# Patient Record
Sex: Female | Born: 1940
Health system: Southern US, Community
[De-identification: ages and names within clinical notes are randomized; demographics above are authoritative.]

## PROBLEM LIST (undated history)

## (undated) DIAGNOSIS — D649 Anemia, unspecified: Secondary | ICD-10-CM

## (undated) DIAGNOSIS — F419 Anxiety disorder, unspecified: Secondary | ICD-10-CM

## (undated) DIAGNOSIS — K219 Gastro-esophageal reflux disease without esophagitis: Secondary | ICD-10-CM

## (undated) HISTORY — PX: OTHER SURGICAL HISTORY: SHX169

## (undated) HISTORY — PX: TUBAL LIGATION: SHX77

## (undated) HISTORY — DX: Anemia, unspecified: D64.9

## (undated) HISTORY — PX: CATARACT EXTRACTION: SUR2

---

## 2012-05-23 ENCOUNTER — Encounter: Payer: Self-pay | Admitting: Family Medicine

## 2012-05-23 ENCOUNTER — Ambulatory Visit (INDEPENDENT_AMBULATORY_CARE_PROVIDER_SITE_OTHER): Payer: Medicare Other | Admitting: Family Medicine

## 2012-05-23 VITALS — BP 150/90 | HR 87 | Resp 16 | Ht 64.5 in | Wt 132.4 lb

## 2012-05-23 DIAGNOSIS — Z1321 Encounter for screening for nutritional disorder: Secondary | ICD-10-CM

## 2012-05-23 DIAGNOSIS — H532 Diplopia: Secondary | ICD-10-CM

## 2012-05-23 DIAGNOSIS — Z130 Encounter for screening for diseases of the blood and blood-forming organs and certain disorders involving the immune mechanism: Secondary | ICD-10-CM

## 2012-05-23 DIAGNOSIS — Z720 Tobacco use: Secondary | ICD-10-CM

## 2012-05-23 DIAGNOSIS — D649 Anemia, unspecified: Secondary | ICD-10-CM | POA: Insufficient documentation

## 2012-05-23 DIAGNOSIS — Z13220 Encounter for screening for lipoid disorders: Secondary | ICD-10-CM

## 2012-05-23 DIAGNOSIS — R030 Elevated blood-pressure reading, without diagnosis of hypertension: Secondary | ICD-10-CM

## 2012-05-23 DIAGNOSIS — IMO0001 Reserved for inherently not codable concepts without codable children: Secondary | ICD-10-CM

## 2012-05-23 DIAGNOSIS — F17200 Nicotine dependence, unspecified, uncomplicated: Secondary | ICD-10-CM

## 2012-05-23 DIAGNOSIS — H1130 Conjunctival hemorrhage, unspecified eye: Secondary | ICD-10-CM

## 2012-05-23 NOTE — Assessment & Plan Note (Signed)
Diplopia on and off for the past few months, possible just related to eye, may be secondary to elevated blood pressure, or possible intracranial mass though neuro exam normal Refer to optho for check, RTC 4 weeks, if there exam is negative will obtain MRI head

## 2012-05-23 NOTE — Assessment & Plan Note (Signed)
Check CBC 

## 2012-05-23 NOTE — Assessment & Plan Note (Addendum)
This should resolve without intervention, she can stop visine

## 2012-05-23 NOTE — Assessment & Plan Note (Signed)
Improved some on recheck, I think she does have underlying HTN. Recheck in 4 weeks , labs to be done, CBC, CMET, TSH, lipid panel, Vit D

## 2012-05-23 NOTE — Assessment & Plan Note (Signed)
Counseled on cessation 

## 2012-05-23 NOTE — Patient Instructions (Signed)
I recommend calcium (1200mg ) and Vit D (800IU) Get the labs done fasting You will be referred to eye doctor F/U 4 weeks for blood pressure and vision

## 2012-05-23 NOTE — Progress Notes (Addendum)
  Subjective:    Patient ID: Danielle Price, female    DOB: 1941-08-24, 71 y.o.   MRN: [id number]  HPI Pt presents to establish care, no PCP > 15 years. She has not seen a physician in 15 years. She presents with redness of left eye for the past week, no injury to eye, she has had in the past but it resolved after a few weeks. She also has double vision on and off for the past few months, requesting eye referral, her optmetrist in the past had concern for early glaucoma. Her brother had a benign brain tumor Anemia- history of chronic anemia, had allergic reaction to [facility] in the 70's and was never tried on anything else.   Review of Systems   GEN- denies fatigue, fever, weight loss,weakness, recent illness HEENT- denies eye drainage, +change in vision, nasal discharge, CVS- denies chest pain, palpitations RESP- denies SOB, cough, wheeze ABD- denies N/V, change in stools, abd pain GU- denies dysuria, hematuria, dribbling, incontinence MSK- denies joint pain, muscle aches, injury Neuro- denies headache, dizziness, syncope, seizure activity      Objective:   Physical Exam GEN- NAD, alert and oriented x3 HEENT- PERRL, EOMI,left subconjunctival hemorrhage, ? Blocked gland in right lower lid pink conjunctiva, MMM, oropharynx clear, fundoscopic exam benign Neck- Supple,  CVS- RRR, no murmur RESP-CTAB ABD-NABS,soft,NT,ND EXT- No edema Pulses- Radial, DP- 2+ Neuro-CNII-XII grossly in tact, motor equal bialt, no focal deficits, sensation normal, speech normal Psych-normal affect and Mood        Assessment & Plan:

## 2012-05-23 NOTE — Addendum Note (Signed)
Addended by: Milinda Antis F on: 05/23/2012 04:52 PM   Modules accepted: Orders

## 2012-05-24 ENCOUNTER — Other Ambulatory Visit: Payer: Self-pay | Admitting: Family Medicine

## 2012-05-24 LAB — COMPREHENSIVE METABOLIC PANEL
ALT: 10 U/L (ref 0–35)
AST: 15 U/L (ref 0–37)
BUN: 12 mg/dL (ref 6–23)
CO2: 27 mEq/L (ref 19–32)
Creat: 0.78 mg/dL (ref 0.50–1.10)
Total Bilirubin: 0.9 mg/dL (ref 0.3–1.2)

## 2012-05-24 LAB — CBC WITH DIFFERENTIAL/PLATELET
Basophils Absolute: 0 10*3/uL (ref 0.0–0.1)
Eosinophils Relative: 1 % (ref 0–5)
HCT: 42.2 % (ref 36.0–46.0)
Hemoglobin: 14.5 g/dL (ref 12.0–15.0)
Lymphocytes Relative: 23 % (ref 12–46)
Lymphs Abs: 1.7 10*3/uL (ref 0.7–4.0)
MCV: 89.8 fL (ref 78.0–100.0)
Monocytes Absolute: 0.7 10*3/uL (ref 0.1–1.0)
Monocytes Relative: 9 % (ref 3–12)
RDW: 13 % (ref 11.5–15.5)
WBC: 7.8 10*3/uL (ref 4.0–10.5)

## 2012-05-24 LAB — LIPID PANEL
HDL: 48 mg/dL (ref >39)
LDL Cholesterol: 106 mg/dL — ABNORMAL HIGH (ref 0–99)
Triglycerides: 104 mg/dL (ref <150)
VLDL: 21 mg/dL (ref 0–40)

## 2012-05-28 LAB — VITAMIN D 1,25 DIHYDROXY
Vitamin D 1, 25 (OH)2 Total: 66 pg/mL (ref 18–72)
Vitamin D2 1, 25 (OH)2: <8 pg/mL
Vitamin D3 1, 25 (OH)2: 66 pg/mL

## 2012-05-30 LAB — HEMOGLOBIN A1C
Hgb A1c MFr Bld: 5.5 % (ref <5.7)
Mean Plasma Glucose: 111 mg/dL (ref <117)

## 2012-06-05 ENCOUNTER — Telehealth: Payer: Self-pay | Admitting: Family Medicine

## 2012-06-05 DIAGNOSIS — H532 Diplopia: Secondary | ICD-10-CM

## 2012-06-06 NOTE — Telephone Encounter (Signed)
Referral sent 

## 2012-06-10 ENCOUNTER — Telehealth: Payer: Self-pay | Admitting: Family Medicine

## 2012-06-17 NOTE — Telephone Encounter (Signed)
Patient received an eye appointment

## 2012-06-20 ENCOUNTER — Encounter: Payer: Self-pay | Admitting: Family Medicine

## 2012-06-20 ENCOUNTER — Ambulatory Visit (INDEPENDENT_AMBULATORY_CARE_PROVIDER_SITE_OTHER): Payer: Medicare Other | Admitting: Family Medicine

## 2012-06-20 VITALS — BP 126/74 | HR 78 | Resp 18 | Ht 64.5 in | Wt 130.1 lb

## 2012-06-20 DIAGNOSIS — R030 Elevated blood-pressure reading, without diagnosis of hypertension: Secondary | ICD-10-CM

## 2012-06-20 DIAGNOSIS — Z720 Tobacco use: Secondary | ICD-10-CM

## 2012-06-20 DIAGNOSIS — IMO0001 Reserved for inherently not codable concepts without codable children: Secondary | ICD-10-CM

## 2012-06-20 DIAGNOSIS — F17200 Nicotine dependence, unspecified, uncomplicated: Secondary | ICD-10-CM

## 2012-06-20 DIAGNOSIS — R7309 Other abnormal glucose: Secondary | ICD-10-CM

## 2012-06-20 DIAGNOSIS — R739 Hyperglycemia, unspecified: Secondary | ICD-10-CM

## 2012-06-20 DIAGNOSIS — H532 Diplopia: Secondary | ICD-10-CM

## 2012-06-20 LAB — HEMOGLOBIN A1C: Mean Plasma Glucose: 111 mg/dL (ref <117)

## 2012-06-20 NOTE — Progress Notes (Signed)
  Subjective:    Patient ID: Danielle Price, female    DOB: 06-29-1941, 71 y.o.   MRN: [id number]  HPI Pt here to f/u blood pressure and labs. She was seen by ophthalmology for the diplopia she was diagnosed with glaucoma also found to have cataract which he think is contributing to the diplopia this has improved some since her last visit. Labs reviewed with patient she was noted to have elevated blood sugar.  She will think about colonoscopy She declines going for mammogram her last one was greater than 10 years ago  Review of Systems  GEN- denies fatigue, fever, weight loss,weakness, recent illness HEENT- denies eye drainage, +change in vision, nasal discharge, CVS- denies chest pain, palpitations RESP- denies SOB, cough, wheeze ABD- denies N/V, change in stools, abd pain GU- denies dysuria, hematuria, dribbling, incontinence MSK- denies joint pain, muscle aches, injury Neuro- denies headache, dizziness, syncope, seizure activity      Objective:   Physical Exam GEN- NAD, alert and oriented x3 HEENT- PERRL, EOMI, non injected sclera, pink conjunctiva, MMM, oropharynx clear CVS- RRR, no murmur RESP-CTAB EXT- No edema Pulses- Radial, DP- 2+        Assessment & Plan:

## 2012-06-20 NOTE — Assessment & Plan Note (Signed)
Blood pressure much improved today we'll continue to monitor no medications needed

## 2012-06-20 NOTE — Assessment & Plan Note (Signed)
This has improved she has ongoing assessment by ophthalmology

## 2012-06-20 NOTE — Assessment & Plan Note (Signed)
Fasting CBG elevated at 119, check A1C, family history of DM

## 2012-06-20 NOTE — Patient Instructions (Signed)
Get the A1C done today  Keep an eye on your blood pressure, call if elevated  > 150/90  F/U 4 months

## 2012-06-20 NOTE — Assessment & Plan Note (Signed)
Unchanged counseled on cessation

## 2012-10-15 ENCOUNTER — Ambulatory Visit: Payer: Medicare Other | Admitting: Family Medicine

## 2013-12-11 ENCOUNTER — Other Ambulatory Visit: Payer: Self-pay | Admitting: Gastroenterology

## 2013-12-11 ENCOUNTER — Encounter (INDEPENDENT_AMBULATORY_CARE_PROVIDER_SITE_OTHER): Payer: Self-pay

## 2013-12-11 ENCOUNTER — Ambulatory Visit (INDEPENDENT_AMBULATORY_CARE_PROVIDER_SITE_OTHER): Payer: Medicare Other | Admitting: Gastroenterology

## 2013-12-11 ENCOUNTER — Encounter: Payer: Self-pay | Admitting: Gastroenterology

## 2013-12-11 VITALS — BP 152/87 | HR 87 | Temp 98.4°F | Wt 127.4 lb

## 2013-12-11 DIAGNOSIS — R1013 Epigastric pain: Secondary | ICD-10-CM

## 2013-12-11 DIAGNOSIS — K3189 Other diseases of stomach and duodenum: Secondary | ICD-10-CM

## 2013-12-11 DIAGNOSIS — G8929 Other chronic pain: Secondary | ICD-10-CM

## 2013-12-11 DIAGNOSIS — Z1211 Encounter for screening for malignant neoplasm of colon: Secondary | ICD-10-CM

## 2013-12-11 MED ORDER — OMEPRAZOLE 20 MG PO CPDR: 20.0000 mg | DELAYED_RELEASE_CAPSULE | Freq: Every day | ORAL | Status: DC

## 2013-12-11 NOTE — Patient Instructions (Addendum)
  Continue to take Prilosec each morning, 30 minutes before breakfast.  Please complete the blood work; we will call with the results. We have scheduled you for an ultrasound of your belly.  Further recommendations regarding colonoscopy and possible upper endoscopy to follow after review of labs and imaging.   Diet for Gastroesophageal Reflux Disease, Adult Reflux (acid reflux) is when acid from your stomach flows up into the esophagus. When acid comes in contact with the esophagus, the acid causes irritation and soreness (inflammation) in the esophagus. When reflux happens often or so severely that it causes damage to the esophagus, it is called gastroesophageal reflux disease (GERD). Nutrition therapy can help ease the discomfort of GERD. FOODS OR DRINKS TO AVOID OR LIMIT  Smoking or chewing tobacco. Nicotine is one of the most potent stimulants to acid production in the gastrointestinal tract.  Caffeinated and decaffeinated coffee and black tea.  Regular or low-calorie carbonated beverages or energy drinks (caffeine-free carbonated beverages are allowed).   Strong spices, such as black pepper, white pepper, red pepper, cayenne, curry powder, and chili powder.  Peppermint or spearmint.  Chocolate.  High-fat foods, including meats and fried foods. Extra added fats including oils, butter, salad dressings, and nuts. Limit these to less than 8 tsp per day.  Fruits and vegetables if they are not tolerated, such as citrus fruits or tomatoes.  Alcohol.  Any food that seems to aggravate your condition. If you have questions regarding your diet, call your caregiver or a registered dietitian. OTHER THINGS THAT MAY HELP GERD INCLUDE:   Eating your meals slowly, in a relaxed setting.  Eating 5 to 6 small meals per day instead of 3 large meals.  Eliminating food for a period of time if it causes distress.  Not lying down until 3 hours after eating a meal.  Keeping the head of your bed  raised 6 to 9 inches (15 to 23 cm) by using a foam wedge or blocks under the legs of the bed. Lying flat may make symptoms worse.  Being physically active. Weight loss may be helpful in reducing reflux in overweight or obese adults.  Wear loose fitting clothing EXAMPLE MEAL PLAN This meal plan is approximately 2,000 calories based on [email or web address] meal planning guidelines. Breakfast   cup cooked oatmeal.  1 cup strawberries.  1 cup low-fat milk.  1 oz almonds. Snack  1 cup cucumber slices.  6 oz yogurt (made from low-fat or fat-free milk). Lunch  2 slice whole-wheat bread.  2 oz sliced [place].  2 tsp mayonnaise.  1 cup blueberries.  1 cup snap peas. Snack  6 whole-wheat crackers.  1 oz string cheese. Dinner   cup brown rice.  1 cup mixed veggies.  1 tsp olive oil.  3 oz grilled fish. Document Released: 10/16/2005 Document Revised: 01/08/2012 Document Reviewed: 09/01/2011 [facility] Patient Information 2014 [place], [place].

## 2013-12-11 NOTE — Progress Notes (Signed)
Primary Care Physician:  Vic Blackbird, MD Primary Gastroenterologist:  Dr. Oneida Alar   Chief Complaint  Patient presents with  . Abdominal Pain    HPI:   Danielle Price is a very pleasant, otherwise healthy 73 year old female presenting today as a self-referral for abdominal discomfort. States could feel coffee going into LUQ, down LLQ side, gas bubbling, gas bubbling up under rib cage on both sides. Would have a BM X 2, then would go away. Usually first small bowel movement is small balls, then a softer stool. Then sensation would come at night. Tried to change diet. Only thing that has helped has been Prilosec OTC. Took for 14 days and felt great while on it. Last one on Saturday, Feb 7. Sunday returned. NO pain, just uncomfortable, tight. Took OTC Prilosec and was gone. Would fold a pillow in half and lay across it, which would ease the symptoms. No heartburn. Has started to clear her throat a lot. No dysphagia. No globus sensation. Didn't clear her throat with taking Prilosec daily. No N/V. Good appetite. No changes in bowel habits. Has noted scant hematochezia in past.    No prior colonoscopy. No prior EGD. Wants evaluation of gallbladder. Willing to proceed with colonoscopy in future.   Past Medical History  Diagnosis Date  . Anemia     while yong  . Glaucoma     Dr. Martie Round [facility]    Past Surgical History  Procedure Laterality Date  . Tubal ligation    . Cataract extraction      Current Outpatient Prescriptions  Medication Sig Dispense Refill  . latanoprost (XALATAN) 0.005 % ophthalmic solution Place 1 drop into both eyes at bedtime.      . methylcellulose (ARTIFICIAL TEARS) 1 % ophthalmic solution 1 drop as needed.      Marland Kitchen omeprazole (PRILOSEC) 20 MG capsule Take 20 mg by mouth daily.       No current facility-administered medications for this visit.    Allergies as of 12/11/2013 - Review Complete 12/11/2013  Allergen Reaction Noted  . Iron   05/23/2012    Family History  Problem Relation Age of Onset  . Heart disease Mother   . Cancer Father     prostate   . COPD Father   . Cancer Brother     prostate  . Hyperlipidemia Brother   . Hyperlipidemia Brother   . Hypertension Brother   . Colon cancer Neg Hx   . Colon polyps Brother     had to have colectomy    History   Social History  . Marital Status: Divorced    Spouse Name: N/A    Number of Children: N/A  . Years of Education: N/A   Occupational History  . Retired     Radiation protection practitioner   Social History Main Topics  . Smoking status: Current Every Day Smoker  . Smokeless tobacco: Not on file     Comment: 7 or 8 cigarettes a day  . Alcohol Use: No  . Drug Use: No  . Sexual Activity: Not on file   Other Topics Concern  . Not on file   Social History Narrative  . No narrative on file    Review of Systems: Gen: Denies any fever, chills, fatigue, weight loss, lack of appetite.  CV: Denies chest pain, heart palpitations, peripheral edema, syncope.  Resp: Denies shortness of breath at rest or with exertion. Denies wheezing or cough.  GI: see HPI GU :  Denies urinary burning, urinary frequency, urinary hesitancy MS: Denies joint pain, muscle weakness, cramps, or limitation of movement.  Derm: Denies rash, itching, dry skin Psych: Denies depression, anxiety, memory loss, and confusion Heme: Denies bruising, bleeding, and enlarged lymph nodes.  Physical Exam: BP 152/87  Pulse 87  Temp(Src) 98.4 F (36.9 C) (Oral)  Wt 127 lb 6.4 oz (57.788 kg) General:   Alert and oriented. Pleasant and cooperative. Well-nourished and well-developed.  Head:  Normocephalic and atraumatic. Eyes:  Without icterus, sclera clear and conjunctiva pink.  Ears:  Normal auditory acuity. Nose:  No deformity, discharge,  or lesions. Mouth:  No deformity or lesions, oral mucosa pink.  Neck:  Supple, without mass or thyromegaly. Lungs:  Clear to auscultation bilaterally. No wheezes,  rales, or rhonchi. No distress.  Heart:  S1, S2 present without murmurs appreciated.  Abdomen:  +BS, soft, non-tender and non-distended. No HSM noted. No guarding or rebound. No masses appreciated.  Rectal:  Deferred  Msk:  Symmetrical without gross deformities. Normal posture. Extremities:  Without clubbing or edema. Neurologic:  Alert and  oriented x4;  grossly normal neurologically. Skin:  Intact without significant lesions or rashes. Cervical Nodes:  No significant cervical adenopathy. Psych:  Alert and cooperative. Normal mood and affect.

## 2013-12-11 NOTE — Assessment & Plan Note (Signed)
73 year old female presenting with new-onset atypical symptoms that appear to be reflux-related and improved with Prilosec OTC. No red flags such as loss of appetite, dysphagia, N/V, or abdominal pain per se. Gallbladder remains in situ. She is quite concerned about her gallbladder and requests and [place] of abdomen.   I have discussed continuing Prilosec each morning, 30 minutes before breakfast. If no improvement with dietary and medication, she will need an upper endoscopy.  We will also proceed with an [place] of abdomen at her request CBC, CMP updated Further recommendations after labs and imaging reviewed.

## 2013-12-11 NOTE — Assessment & Plan Note (Signed)
No prior lower GI evaluation. No rectal bleeding. Will need colonoscopy in near future. Patient would like to evaluate for biliary etiology first, which I feel is less likely in this scenario. However, proceed with [place] of abdomen and labs. EGD with TCS if negative [place].

## 2013-12-11 NOTE — Addendum Note (Signed)
Addended by: Rosanne Sack on: 12/11/2013 02:21 PM   Modules accepted: Orders

## 2013-12-15 NOTE — Progress Notes (Signed)
cc'd to pcp 

## 2013-12-16 ENCOUNTER — Other Ambulatory Visit (HOSPITAL_COMMUNITY): Payer: Medicare Other

## 2013-12-17 ENCOUNTER — Ambulatory Visit (HOSPITAL_COMMUNITY): Payer: Medicare Other

## 2013-12-19 ENCOUNTER — Ambulatory Visit (HOSPITAL_COMMUNITY): Payer: Medicare Other

## 2013-12-24 ENCOUNTER — Ambulatory Visit (HOSPITAL_COMMUNITY): Payer: Medicare Other

## 2013-12-29 ENCOUNTER — Ambulatory Visit (HOSPITAL_COMMUNITY)
Admission: RE | Admit: 2013-12-29 | Discharge: 2013-12-29 | Disposition: A | Discharge status: Home / Self Care | Payer: Medicare Other | Source: Ambulatory Visit | Attending: Gastroenterology | Admitting: Gastroenterology

## 2013-12-29 DIAGNOSIS — R1013 Epigastric pain: Secondary | ICD-10-CM

## 2013-12-29 DIAGNOSIS — G8929 Other chronic pain: Secondary | ICD-10-CM

## 2013-12-29 DIAGNOSIS — N289 Disorder of kidney and ureter, unspecified: Secondary | ICD-10-CM | POA: Insufficient documentation

## 2013-12-29 DIAGNOSIS — K769 Liver disease, unspecified: Secondary | ICD-10-CM | POA: Insufficient documentation

## 2013-12-29 DIAGNOSIS — R109 Unspecified abdominal pain: Secondary | ICD-10-CM | POA: Insufficient documentation

## 2013-12-29 LAB — CBC
HCT: 40.6 % (ref 36.0–46.0)
Hemoglobin: 13.5 g/dL (ref 12.0–15.0)
MCH: 30.4 pg (ref 26.0–34.0)
MCHC: 33.3 g/dL (ref 30.0–36.0)
MCV: 91.4 fL (ref 78.0–100.0)
PLATELETS: 310 10*3/uL (ref 150–400)
RBC: 4.44 MIL/uL (ref 3.87–5.11)
RDW: 13.3 % (ref 11.5–15.5)
WBC: 6.2 10*3/uL (ref 4.0–10.5)

## 2013-12-29 LAB — COMPREHENSIVE METABOLIC PANEL
ALK PHOS: 68 U/L (ref 39–117)
ALT: 10 U/L (ref 0–35)
AST: 14 U/L (ref 0–37)
Albumin: 4.5 g/dL (ref 3.5–5.2)
BILIRUBIN TOTAL: 0.6 mg/dL (ref 0.2–1.2)
BUN: 14 mg/dL (ref 6–23)
CO2: 26 mEq/L (ref 19–32)
Calcium: 9.6 mg/dL (ref 8.4–10.5)
Chloride: 105 mEq/L (ref 96–112)
Creat: 0.77 mg/dL (ref 0.50–1.10)
Glucose, Bld: 95 mg/dL (ref 70–99)
Potassium: 4.5 mEq/L (ref 3.5–5.3)
SODIUM: 142 meq/L (ref 135–145)
TOTAL PROTEIN: 6.5 g/dL (ref 6.0–8.3)

## 2014-01-07 ENCOUNTER — Other Ambulatory Visit: Payer: Self-pay | Admitting: Gastroenterology

## 2014-01-07 ENCOUNTER — Telehealth: Payer: Self-pay

## 2014-01-07 DIAGNOSIS — D1803 Hemangioma of intra-abdominal structures: Secondary | ICD-10-CM

## 2014-01-07 DIAGNOSIS — N289 Disorder of kidney and ureter, unspecified: Secondary | ICD-10-CM

## 2014-01-07 DIAGNOSIS — K8020 Calculus of gallbladder without cholecystitis without obstruction: Secondary | ICD-10-CM

## 2014-01-07 NOTE — Telephone Encounter (Signed)
Pt called and was informed. She said to ask Vicente Males is there ANY other test she can do instead of this one. She is claustrophobic. I told her there is a place in [place] that does open MRI's. But she still did not want to do it. She said that her brother had an MRI once and the noise left his hearing damaged. She is aware that Vicente Males is out of the office today. Please advise!

## 2014-01-07 NOTE — Progress Notes (Signed)
Quick Note:  CBC and CMP normal. [place] of abdomen with gallstones but no evidence of cholecystitis.  Question an hemangioma in liver. There is also a lesion in right kidney that needs further evaluation.  NEED HEPATIC PROTOCOL MRI with and without contrast ASAP to further characterize this.   Ultimately will need TCS/EGD, but these lesions need to be evaluated first, ASAP ______

## 2014-01-07 NOTE — Telephone Encounter (Signed)
Per Danielle Price, she has a message for this pt ( it is not in my box).  Pt needs to be informed of some info before Danielle Price tells her about her MRI to be scheduled.   See: progress note under the abdominal US.   Danielle Price said her CBC and CMP are normal. [place] of abdomen with gallstones but no evidence of cholecystitis, Question an hemangioma in the liver. There is also a lesion in right kidney that needs further evaluation. NEED HEPATIC PROTOCOL MRI with and without contrast ASAP to further characterize this.  Ultimately will need TCS/EGD, but these lesions need to be evaluated first, ASAP.

## 2014-01-08 NOTE — Telephone Encounter (Signed)
I will need to talk with radiology about the best modality for this. They recommended MRI for characterization. CT may not give best results. I think it is imperative she has this done to further sort it out. I will ask radiology.

## 2014-01-09 ENCOUNTER — Telehealth: Payer: Self-pay | Admitting: Gastroenterology

## 2014-01-09 ENCOUNTER — Telehealth: Payer: Self-pay

## 2014-01-09 NOTE — Telephone Encounter (Signed)
I spoke to pt at 8:45 AM. Please see separate note.

## 2014-01-09 NOTE — Telephone Encounter (Signed)
If she is willing to do MRI first, I can provide a one time dose of Ativan or something similar for 30 minutes prior to MRI.

## 2014-01-09 NOTE — Telephone Encounter (Signed)
Pt refuses to go to VM, refuses to leave a message. She wants someone to answer her medical questions regarding her MRI that she has scheduled. She was rather short with me about why I can't get anyone to return her call from yesterday and why I can't get anyone to the phone today. I told her that DS was filling in for [facility] and she has a patient in the back and if she wants to continue holding that was up to her.

## 2014-01-09 NOTE — Telephone Encounter (Signed)
Pt called and is concerned about getting the MRI done and does not know if she can do it or not. She said she cannot tolerate closed spaces. I asked her did she want [place] to check about the open MRI in [place]. She said if she was given something prior to the MRI for pain and anxiety she might be able to tolerate the one here.  She even said she would like to have the colonoscopy done first and I told her that Vicente Males said that the liver needs to be evaluated first.  Please advise!

## 2014-01-09 NOTE — Telephone Encounter (Signed)
Please see note of 01/07/2014 also.

## 2014-01-12 NOTE — Telephone Encounter (Signed)
I called pt. She has many questions and would like to speak to Laban Emperor, NP or a doctor. She still wants to know why she has to have the MRI before the TCS/EGD. ( I have told her everything documented). She has questions about the actual MRI and said if she discussed it with someone she might not need the Ativan.  Vicente Males, she said if you could call her she is at [phone].

## 2014-01-12 NOTE — Telephone Encounter (Signed)
Patient agreeable to proceed with MRI.  Can we find out a ballpark estimate of what the cost of MRI would be at [facility]?

## 2014-01-13 NOTE — Telephone Encounter (Signed)
Vicente Males, will you send the Rx in for the Ativan? Thanks!

## 2014-01-13 NOTE — Telephone Encounter (Signed)
I haven't heard from her. I did not know that she said she did not think that she would need it.

## 2014-01-13 NOTE — Telephone Encounter (Signed)
Per Santiago Glad from the [facility], patient responsibility after insurance is $92.23  She is going to call the patient and discuss the financial responsibility with her, today.

## 2014-01-13 NOTE — Telephone Encounter (Signed)
I spoke with Santiago Glad at [facility] and she is going to have Nira Conn [phone]) to give me a call with a quote for the MRI.

## 2014-01-13 NOTE — Telephone Encounter (Signed)
Patient told me she did not feel she needed it. Does she still need?

## 2014-01-13 NOTE — Telephone Encounter (Signed)
See phone note of 01/09/2014.

## 2014-01-14 ENCOUNTER — Ambulatory Visit (HOSPITAL_COMMUNITY)
Admission: RE | Admit: 2014-01-14 | Discharge: 2014-01-14 | Disposition: A | Discharge status: Home / Self Care | Payer: Medicare Other | Source: Ambulatory Visit | Attending: Gastroenterology | Admitting: Gastroenterology

## 2014-01-14 DIAGNOSIS — D1809 Hemangioma of other sites: Secondary | ICD-10-CM | POA: Insufficient documentation

## 2014-01-14 DIAGNOSIS — K8020 Calculus of gallbladder without cholecystitis without obstruction: Secondary | ICD-10-CM | POA: Insufficient documentation

## 2014-01-14 DIAGNOSIS — D3000 Benign neoplasm of unspecified kidney: Secondary | ICD-10-CM | POA: Insufficient documentation

## 2014-01-14 DIAGNOSIS — D1803 Hemangioma of intra-abdominal structures: Secondary | ICD-10-CM

## 2014-01-14 DIAGNOSIS — N289 Disorder of kidney and ureter, unspecified: Secondary | ICD-10-CM | POA: Insufficient documentation

## 2014-01-14 MED ORDER — SODIUM CHLORIDE 0.9 % IV SOLN
INTRAVENOUS | Status: AC
  Filled 2014-01-14: qty 200

## 2014-01-14 MED ORDER — GADOBENATE DIMEGLUMINE 529 MG/ML IV SOLN
11.0000 mL | Freq: Once | INTRAVENOUS | Status: AC | PRN
  Administered 2014-01-14: 11 mL via INTRAVENOUS

## 2014-01-21 NOTE — Progress Notes (Signed)
Quick Note:  Good news! MRI reviewed. Benign hemangiomas of liver and benign area in kidney. She has gallstones.  I recommend TCS/EGD with Dr. Oneida Alar as planned. Does she still have abdominal pain? ______

## 2014-01-22 ENCOUNTER — Other Ambulatory Visit: Payer: Self-pay

## 2014-01-22 ENCOUNTER — Telehealth: Payer: Self-pay

## 2014-01-22 DIAGNOSIS — R1013 Epigastric pain: Secondary | ICD-10-CM

## 2014-01-22 NOTE — Telephone Encounter (Signed)
Pt was seen in the office on 12/11/2013 by Laban Emperor, NP.  It has been recommended she had a TCS/EGD ( Dyspepsia, chronic epigastric pain).  She was triaged since it has been over 30 days since her OV.

## 2014-01-22 NOTE — Progress Notes (Signed)
Quick Note:  Pt is aware and is scheduled for TCS/EGD on 02/23/2014 at 9:30 AM with Dr. Oneida Alar. ( Please see separate triage) She has company now and had to schedule out for Care [facility] and also she wanted early appt. ______

## 2014-01-22 NOTE — Telephone Encounter (Signed)
PREPOPIK-DRINK WATER TO KEEP URINE LIGHT YELLOW. FULL LIQUIDS ON DAY BEFORE PREP.  PT SHOULD DROP OFF RX 3 DAYS PRIOR TO PROCEDURE.

## 2014-01-22 NOTE — Progress Notes (Signed)
Quick Note:  Pt stated her abdominal pain is better, but she wishes to proceed with the procedures. ______

## 2014-01-22 NOTE — Telephone Encounter (Signed)
Gastroenterology Pre-Procedure Review  Request Date: 01/22/2014 Requesting Physician: Laban Emperor, NP and Dr. Oneida Alar   PT IS SCHEDULED FOR TCS/EGD ON 02/23/2014  SHE REQUEST THE LEAST PREP THAT SHE CAN GET   I TOLD HER IT MIGHT BE EXPENSIVE AND SHE SAID SHE STILL WANTS IT  PATIENT REVIEW QUESTIONS: The patient responded to the following health history questions as indicated:    1. Diabetes Melitis: no 2. Joint replacements in the past 12 months: no 3. Major health problems in the past 3 months: no 4. Has an artificial valve or MVP: no 5. Has a defibrillator: no 6. Has been advised in past to take antibiotics in advance of a procedure like teeth cleaning: no    MEDICATIONS & ALLERGIES:    Patient reports the following regarding taking any blood thinners:   Plavix? no Aspirin? no Coumadin? no  Patient confirms/reports the following medications:  Current Outpatient Prescriptions  Medication Sig Dispense Refill  . erythromycin ophthalmic ointment Place 1 application into the right eye daily as needed.      . latanoprost (XALATAN) 0.005 % ophthalmic solution Place 1 drop into both eyes at bedtime.      Marland Kitchen omeprazole (PRILOSEC) 20 MG capsule Take 1 capsule (20 mg total) by mouth daily. 30 minutes before breakfast  30 capsule  3   No current facility-administered medications for this visit.    Patient confirms/reports the following allergies:  Allergies  Allergen Reactions  . Iron     Rash     No orders of the defined types were placed in this encounter.    AUTHORIZATION INFORMATION Primary Insurance:   ID #:   Group #:  Pre-Cert / Auth required:  Pre-Cert / Auth #:   Secondary Insurance:   ID #:   Group #:  Pre-Cert / Auth required:  Pre-Cert / Auth #:   SCHEDULE INFORMATION: Procedure has been scheduled as follows:  Date:  02/23/2014               Time: 9:30 AM  Location: [facility] Short Stay  This Gastroenterology Pre-Precedure Review Form is being routed  to the following provider(s): Barney Drain, MD

## 2014-01-26 ENCOUNTER — Telehealth: Payer: Self-pay | Admitting: *Deleted

## 2014-01-26 MED ORDER — SOD PICOSULFATE-MAG OX-CIT ACD 10-3.5-12 MG-GM-GM PO PACK: 1.0000 | PACK | ORAL | Status: DC

## 2014-01-26 NOTE — Telephone Encounter (Signed)
Pt called stating her pharmacy called her about her prep. Pt wants to know how many ounces that this solution makes, she said she knows she has to mix it and she is concerned about oz, pt said she asked her pharmacy and they told her they were not sure. Please advise [phone]

## 2014-01-26 NOTE — Telephone Encounter (Signed)
I called the pt to explain the prep. She will have only 10 oz of the prep total. ( 5 oz  4 hrs apart.) But she must drink at least 5 8oz glasses of clear liquids of her choice within the next 3 hours after each prep glass.  She expresses understanding. Instructions in the mail for her and she will call if questions.

## 2014-01-26 NOTE — Telephone Encounter (Signed)
Rx sent to the pharmacy and instructions mailed to pt.  

## 2014-02-05 ENCOUNTER — Telehealth: Payer: Self-pay

## 2014-02-05 NOTE — Telephone Encounter (Signed)
Pt called with questions about her prep and having the colonoscopy. She has the prepopik and she was reading on the side effects that nausea and vomiting and headache were on the list of the top side effects.   She is scheduled for 02/23/2014 on a Mon. She was just concerned that if she had a problem on Sunday while she was doing the prep, who would she call.  I told her we have not had any complaints about the above mentioned side effects to my knowledge. Just make sure to drink lots of water and clear liquids. She can call the hospital at [phone] and ask for the GI physician on call if she does have problems.

## 2014-02-05 NOTE — Telephone Encounter (Signed)
REVIEWED. AGREE. 

## 2014-02-06 ENCOUNTER — Encounter (HOSPITAL_COMMUNITY): Payer: Self-pay | Admitting: Pharmacy Technician

## 2014-02-23 ENCOUNTER — Encounter (HOSPITAL_COMMUNITY): Payer: Self-pay | Admitting: *Deleted

## 2014-02-23 ENCOUNTER — Ambulatory Visit (HOSPITAL_COMMUNITY)
Admission: RE | Admit: 2014-02-23 | Discharge: 2014-02-23 | Disposition: A | Discharge status: Home / Self Care | Payer: Medicare Other | Source: Ambulatory Visit | Attending: Gastroenterology | Admitting: Gastroenterology

## 2014-02-23 ENCOUNTER — Encounter (HOSPITAL_COMMUNITY)
Admission: RE | Disposition: A | Discharge status: Home / Self Care | Payer: Self-pay | Source: Ambulatory Visit | Attending: Gastroenterology

## 2014-02-23 DIAGNOSIS — K3189 Other diseases of stomach and duodenum: Secondary | ICD-10-CM | POA: Insufficient documentation

## 2014-02-23 DIAGNOSIS — K621 Rectal polyp: Secondary | ICD-10-CM

## 2014-02-23 DIAGNOSIS — F411 Generalized anxiety disorder: Secondary | ICD-10-CM | POA: Insufficient documentation

## 2014-02-23 DIAGNOSIS — K620 Anal polyp: Secondary | ICD-10-CM | POA: Insufficient documentation

## 2014-02-23 DIAGNOSIS — Z1211 Encounter for screening for malignant neoplasm of colon: Secondary | ICD-10-CM | POA: Insufficient documentation

## 2014-02-23 DIAGNOSIS — K648 Other hemorrhoids: Secondary | ICD-10-CM | POA: Insufficient documentation

## 2014-02-23 DIAGNOSIS — Z79899 Other long term (current) drug therapy: Secondary | ICD-10-CM | POA: Insufficient documentation

## 2014-02-23 DIAGNOSIS — K5730 Diverticulosis of large intestine without perforation or abscess without bleeding: Secondary | ICD-10-CM | POA: Insufficient documentation

## 2014-02-23 DIAGNOSIS — D126 Benign neoplasm of colon, unspecified: Secondary | ICD-10-CM | POA: Insufficient documentation

## 2014-02-23 DIAGNOSIS — K2960 Other gastritis without bleeding: Secondary | ICD-10-CM | POA: Insufficient documentation

## 2014-02-23 DIAGNOSIS — R1013 Epigastric pain: Secondary | ICD-10-CM

## 2014-02-23 HISTORY — PX: ESOPHAGOGASTRODUODENOSCOPY: SHX5428

## 2014-02-23 HISTORY — DX: Anxiety disorder, unspecified: F41.9

## 2014-02-23 HISTORY — PX: COLONOSCOPY: SHX5424

## 2014-02-23 HISTORY — DX: Gastro-esophageal reflux disease without esophagitis: K21.9

## 2014-02-23 SURGERY — COLONOSCOPY
Anesthesia: Moderate Sedation

## 2014-02-23 MED ORDER — MEPERIDINE HCL 100 MG/ML IJ SOLN
INTRAMUSCULAR | Status: DC | PRN
  Administered 2014-02-23 (×4): 25 mg via INTRAVENOUS

## 2014-02-23 MED ORDER — OMEPRAZOLE 20 MG PO CPDR: DELAYED_RELEASE_CAPSULE | ORAL | Status: DC

## 2014-02-23 MED ORDER — MIDAZOLAM HCL 5 MG/5ML IJ SOLN
INTRAMUSCULAR | Status: DC | PRN
  Administered 2014-02-23: 1 mg via INTRAVENOUS
  Administered 2014-02-23: 2 mg via INTRAVENOUS
  Administered 2014-02-23 (×2): 1 mg via INTRAVENOUS
  Administered 2014-02-23 (×2): 2 mg via INTRAVENOUS

## 2014-02-23 MED ORDER — LIDOCAINE VISCOUS 2 % MT SOLN
OROMUCOSAL | Status: DC | PRN
  Administered 2014-02-23: 1 via OROMUCOSAL

## 2014-02-23 MED ORDER — MIDAZOLAM HCL 5 MG/5ML IJ SOLN
INTRAMUSCULAR | Status: AC
  Filled 2014-02-23: qty 10

## 2014-02-23 MED ORDER — STERILE WATER FOR IRRIGATION IR SOLN
Status: DC | PRN
  Administered 2014-02-23: 10:00:00

## 2014-02-23 MED ORDER — MEPERIDINE HCL 100 MG/ML IJ SOLN
INTRAMUSCULAR | Status: AC
  Filled 2014-02-23: qty 2

## 2014-02-23 MED ORDER — SODIUM CHLORIDE 0.9 % IV SOLN
INTRAVENOUS | Status: DC
  Administered 2014-02-23: 09:00:00 via INTRAVENOUS

## 2014-02-23 MED ORDER — LIDOCAINE VISCOUS 2 % MT SOLN
OROMUCOSAL | Status: AC
  Filled 2014-02-23: qty 15

## 2014-02-23 NOTE — Progress Notes (Signed)
REVIEWED.  

## 2014-02-23 NOTE — H&P (Signed)
  Primary Care Physician:  Vic Blackbird, MD Primary Gastroenterologist:  Dr. Oneida Alar  Pre-Procedure History & Physical: HPI:  Danielle Price is a 73 y.o. female here for COLON CANCER SCREENING/dyspepsia/EPIGASTRIC PAIN.  Past Medical History  Diagnosis Date  . Glaucoma     Dr. Martie Round [facility]  . GERD (gastroesophageal reflux disease)   . Anemia     as a teenager  . Anxiety     Past Surgical History  Procedure Laterality Date  . Tubal ligation    . Cataract extraction    . Coccyx chipped off      Prior to Admission medications   Medication Sig Start Date End Date Taking? Authorizing Provider  erythromycin ophthalmic ointment Place 1 application into the right eye daily as needed.   Yes Historical Provider, MD  latanoprost (XALATAN) 0.005 % ophthalmic solution Place 1 drop into both eyes at bedtime.   Yes Historical Provider, MD  omeprazole (PRILOSEC) 20 MG capsule Take 20 mg by mouth daily as needed. 30 minutes before breakfast 12/11/13  Yes Orvil Feil, NP  Sod Picosulfate-Mag Ox-Cit Acd 10-3.5-12 MG-GM-GM PACK Take 1 Container by mouth as directed. 01/26/14  Yes Danie Binder, MD    Allergies as of 01/22/2014 - Review Complete 12/11/2013  Allergen Reaction Noted  . Iron  05/23/2012    Family History  Problem Relation Age of Onset  . Heart disease Mother   . Cancer Father     prostate   . COPD Father   . Cancer Brother     prostate  . Hyperlipidemia Brother   . Hyperlipidemia Brother   . Hypertension Brother   . Colon cancer Neg Hx   . Colon polyps Brother     had to have colectomy    History   Social History  . Marital Status: Widowed    Spouse Name: N/A    Number of Children: N/A  . Years of Education: N/A   Occupational History  . Retired     Radiation protection practitioner   Social History Main Topics  . Smoking status: Current Every Day Smoker -- 0.50 packs/day for 35 years    Types: Cigarettes  . Smokeless tobacco: Not on file     Comment: 7 or 8  cigarettes a day  . Alcohol Use: No  . Drug Use: No  . Sexual Activity: Not on file   Other Topics Concern  . Not on file   Social History Narrative  . No narrative on file    Review of Systems: See HPI, otherwise negative ROS   Physical Exam: BP 153/85  Pulse 81  Temp(Src) 98.2 F (36.8 C) (Oral)  Resp 16  Ht 5\' 4"  (1.626 m)  Wt 127 lb (57.607 kg)  BMI 21.79 kg/m2  SpO2 99% General:   Alert,  pleasant and cooperative in NAD Head:  Normocephalic and atraumatic. Neck:  Supple; Lungs:  Clear throughout to auscultation.    Heart:  Regular rate and rhythm. Abdomen:  Soft, nontender and nondistended. Normal bowel sounds, without guarding, and without rebound.   Neurologic:  Alert and  oriented x4;  grossly normal neurologically.  Impression/Plan:    SCREENING/dyspepsia/EPIGASTRIC PAIN  Plan:  1. TCS/EGD TODAY

## 2014-02-23 NOTE — Op Note (Signed)
[facility] [address] [place], [address]   COLONOSCOPY PROCEDURE REPORT  PATIENT: Danielle Price, Danielle Price  MR#: [id number] BIRTHDATE: Oct 29, 1941 , 73  yrs. old GENDER: Female ENDOSCOPIST: Barney Drain, MD REFERRED FT:[id number] Lohman, M.D. PROCEDURE DATE:  02/23/2014 PROCEDURE:   Colonoscopy with snare polypectomy, with cold biopsy, and with biopsy INDICATIONS:Colorectal cancer screening. MEDICATIONS: Demerol 50 mg IV and Versed 7 mg IV  DESCRIPTION OF PROCEDURE:    Physical exam was performed.  Informed consent was obtained from the patient after explaining the benefits, risks, and alternatives to procedure.  The patient was connected to monitor and placed in left lateral position. Continuous oxygen was provided by nasal cannula and IV medicine administered through an indwelling cannula.  After administration of sedation and rectal exam, the patients rectum was intubated and the EC-[id number] ([id number])  colonoscope was advanced under direct visualization to the ileum.  The scope was removed slowly by carefully examining the color, texture, anatomy, and integrity mucosa on the way out.  The patient was recovered in endoscopy and discharged home in satisfactory condition.    COLON FINDINGS: NORMAL ILEUM. Two sessile polyps were found.  , A sessile polyp measuring 3-6 mm in size was found in the rectum.  A polypectomy was performed using snare cautery.  , There was moderate diverticulosis noted in the sigmoid colon with associated luminal narrowing.  , and Large internal hemorrhoids were found.   PREP QUALITY: good.CECAL W/D TIME: 20 minutes     COMPLICATIONS: None  ENDOSCOPIC IMPRESSION: 1.   THREE COLORECTAL POLYPS REMOVED 2.   Moderate diverticulosis noted in the sigmoid colon 4.   Large internal hemorrhoids   RECOMMENDATIONS: DRINK WATER TO KEEP YOUR URINE LIGHT YELLOW. Use Prilosec 30 minutes prior to your first meal. FOLLOW A HIGH FIBER/LOW FAT DIET.   AVOID ITEMS THAT CAUSE BLOATING.  BIOPSY RESULTS WILL BE BACK IN 7 DAYS.  Next colonoscopy in 5-10 years. FOLLOW UP IN JUL 2015       _______________________________ eSignedBarney Drain, MD 02/23/2014 11:20 AM

## 2014-02-23 NOTE — Op Note (Signed)
[facility] [address] [place], [address]   ENDOSCOPY PROCEDURE REPORT  PATIENT: Danielle, Price  MR#: [id number] BIRTHDATE: Jan 28, 1941 , 73  yrs. old GENDER: Female  ENDOSCOPIST: Barney Drain, MD REFERRED NT:IRWERXV Oneida, M.D.  PROCEDURE DATE: 02/23/2014 PROCEDURE:   EGD w/ biopsy  INDICATIONS:Dyspepsia.   INTERMITTENT RUQ RELIEVED BY PASSING SOLID/WATERY/LOOSE STOOLS. MEDICATIONS: TCS+ Demerol 50 mg IV and Versed 2 mg IV TOPICAL ANESTHETIC:   Viscous Xylocaine  DESCRIPTION OF PROCEDURE:     Physical exam was performed.  Informed consent was obtained from the patient after explaining the benefits, risks, and alternatives to the procedure.  The patient was connected to the monitor and placed in the left lateral position.  Continuous oxygen was provided by nasal cannula and IV medicine administered through an indwelling cannula.  After administration of sedation, the patients esophagus was intubated and the [id number] ([id number])  endoscope was advanced under direct visualization to the second portion of the duodenum.  The scope was removed slowly by carefully examining the color, texture, anatomy, and integrity of the mucosa on the way out.  The patient was recovered in endoscopy and discharged home in satisfactory condition.   ESOPHAGUS: The mucosa of the esophagus appeared normal.   WIDE OPEN LES.   STOMACH: Mild non-erosive gastritis (inflammation) was found in the gastric antrum.  Multiple biopsies were performed using cold forceps.   DUODENUM: The duodenal mucosa showed no abnormalities in the bulb and second portion of the duodenum.  Cold forcep biopsies were taken in the second portion.     COMPLICATIONS:   None  ENDOSCOPIC IMPRESSION: 1.   DYSPEPSIA LIKELY DUE TO GASTRITIS 2.   MILD   Non-erosive gastritis  RECOMMENDATIONS: DRINK WATER TO KEEP YOUR URINE LIGHT YELLOW. Use Prilosec 30 minutes prior to your first meal. FOLLOW A HIGH  FIBER/LOW FAT DIET.  AVOID ITEMS THAT CAUSE BLOATING.  BIOPSY RESULTS WILL BE BACK IN 7 DAYS. Next colonoscopy in 5-10 years.  FOLLOW UP IN JUL 2015   REPEAT EXAM:   _______________________________ Lorrin MaisBarney Drain, MD 02/23/2014 12:36 PM

## 2014-02-23 NOTE — Discharge Instructions (Signed)
You had 3 polyps removed FROM YOUR LEFT COLON AND RECTUM. You have LARGE internal hemorrhoids & DIVERTICULOSIS IN YOUR LEFT COLON. You have MILD gastritis.  I biopsied your stomach, SMALL BOWEL, AND COLON.    DRINK WATER TO KEEP YOUR URINE LIGHT YELLOW.  Use Prilosec 30 minutes prior to your first meal.  FOLLOW A HIGH FIBER/LOW FAT DIET. AVOID ITEMS THAT CAUSE BLOATING. SEE INFO BELOW.  YOUR BIOPSY RESULTS WILL BE BACK IN 7 DAYS.  Next colonoscopy in 5-10 years.   FOLLOW UP IN JUL 2015   ENDOSCOPY Care After Read the instructions outlined below and refer to this sheet in the next week. These discharge instructions provide you with general information on caring for yourself after you leave the hospital. While your treatment has been planned according to the most current medical practices available, unavoidable complications occasionally occur. If you have any problems or questions after discharge, call DR. Pyper Olexa, [phone].  ACTIVITY  You may resume your regular activity, but move at a slower pace for the next 24 hours.   Take frequent rest periods for the next 24 hours.   Walking will help get rid of the air and reduce the bloated feeling in your belly (abdomen).   No driving for 24 hours (because of the medicine (anesthesia) used during the test).   You may shower.   Do not sign any important legal documents or operate any machinery for 24 hours (because of the anesthesia used during the test).    NUTRITION  Drink plenty of fluids.   You may resume your normal diet as instructed by your doctor.   Begin with a light meal and progress to your normal diet. Heavy or fried foods are harder to digest and may make you feel sick to your stomach (nauseated).   Avoid alcoholic beverages for 24 hours or as instructed.    MEDICATIONS  You may resume your normal medications.   WHAT YOU CAN EXPECT TODAY  Some feelings of bloating in the abdomen.   Passage of more gas  than usual.   Spotting of blood in your stool or on the toilet paper  .  IF YOU HAD POLYPS REMOVED DURING THE ENDOSCOPY:  Eat a soft diet IF YOU HAVE NAUSEA, BLOATING, ABDOMINAL PAIN, OR VOMITING.    FINDING OUT THE RESULTS OF YOUR TEST Not all test results are available during your visit. DR. Darrick Penna WILL CALL YOU WITHIN 7 DAYS OF YOUR PROCEDUE WITH YOUR RESULTS. Do not assume everything is normal if you have not heard from DR. Titus Drone IN ONE WEEK, CALL HER OFFICE AT [phone].  SEEK IMMEDIATE MEDICAL ATTENTION AND CALL THE OFFICE: [phone] IF:  You have more than a spotting of blood in your stool.   Your belly is swollen (abdominal distention).   You are nauseated or vomiting.   You have a temperature over 101F.   You have abdominal pain or discomfort that is severe or gets worse throughout the day.   Gastritis  Gastritis is inflammation (the body's way of reacting to injury and/or infection) of the stomach. It is often caused by viral or bacterial (germ) infections. It can also be caused BY ASPIRIN, BC/GOODY POWDER'S, (IBUPROFEN) MOTRIN, OR ALEVE (NAPROXEN), chemicals (including alcohol), SPICY FOODS, and medications. This illness may be associated with generalized malaise (feeling tired, not well), UPPER ABDOMINAL STOMACH cramps, and fever. One common bacterial cause of gastritis is an organism known as H. Pylori. This can be treated with antibiotics.  High-Fiber Diet A high-fiber diet changes your normal diet to include more whole grains, legumes, fruits, and vegetables. Changes in the diet involve replacing refined carbohydrates with unrefined foods. The calorie level of the diet is essentially unchanged. The Dietary Reference Intake (recommended amount) for adult males is 38 grams per day. For adult females, it is 25 grams per day. Pregnant and lactating women should consume 28 grams of fiber per day. Fiber is the intact part of a plant that is not broken down during  digestion. Functional fiber is fiber that has been isolated from the plant to provide a beneficial effect in the body. PURPOSE  Increase stool bulk.   Ease and regulate bowel movements.   Lower cholesterol.  INDICATIONS THAT YOU NEED MORE FIBER  Constipation and hemorrhoids.   Uncomplicated diverticulosis (intestine condition) and irritable bowel syndrome.   Weight management.   As a protective measure against hardening of the arteries (atherosclerosis), diabetes, and cancer.   GUIDELINES FOR INCREASING FIBER IN THE DIET  Start adding fiber to the diet slowly. A gradual increase of about 5 more grams (2 slices of whole-wheat bread, 2 servings of most fruits or vegetables, or 1 bowl of high-fiber cereal) per day is best. Too rapid an increase in fiber may result in constipation, flatulence, and bloating.   Drink enough water and fluids to keep your urine clear or pale yellow. Water, juice, or caffeine-free drinks are recommended. Not drinking enough fluid may cause constipation.   Eat a variety of high-fiber foods rather than one type of fiber.   Try to increase your intake of fiber through using high-fiber foods rather than fiber pills or supplements that contain small amounts of fiber.   The goal is to change the types of food eaten. Do not supplement your present diet with high-fiber foods, but replace foods in your present diet.  INCLUDE A VARIETY OF FIBER SOURCES  Replace refined and processed grains with whole grains, canned fruits with fresh fruits, and incorporate other fiber sources. White rice, white breads, and most bakery goods contain little or no fiber.   Brown whole-grain rice, buckwheat oats, and many fruits and vegetables are all good sources of fiber. These include: broccoli, Brussels sprouts, cabbage, cauliflower, beets, sweet potatoes, white potatoes (skin on), carrots, tomatoes, eggplant, squash, berries, fresh fruits, and dried fruits.   Cereals appear to be  the richest source of fiber. Cereal fiber is found in whole grains and bran. Bran is the fiber-rich outer coat of cereal grain, which is largely removed in refining. In whole-grain cereals, the bran remains. In breakfast cereals, the largest amount of fiber is found in those with "bran" in their names. The fiber content is sometimes indicated on the label.   You may need to include additional fruits and vegetables each day.   In baking, for 1 cup white flour, you may use the following substitutions:   1 cup whole-wheat flour minus 2 tablespoons.   1/2 cup white flour plus 1/2 cup whole-wheat flour.   Low-Fat Diet BREADS, CEREALS, PASTA, RICE, DRIED PEAS, AND BEANS These products are high in carbohydrates and most are low in fat. Therefore, they can be increased in the diet as substitutes for fatty foods. They too, however, contain calories and should not be eaten in excess. Cereals can be eaten for snacks as well as for breakfast.  Include foods that contain fiber (fruits, vegetables, whole grains, and legumes). Research shows that fiber may lower blood cholesterol levels, especially the  water-soluble fiber found in fruits, vegetables, oat products, and legumes. FRUITS AND VEGETABLES It is good to eat fruits and vegetables. Besides being sources of fiber, both are rich in vitamins and some minerals. They help you get the daily allowances of these nutrients. Fruits and vegetables can be used for snacks and desserts. MEATS Limit lean meat, chicken, [place], and fish to no more than 6 ounces per day. Beef, Pork, and Lamb Use lean cuts of beef, pork, and lamb. Lean cuts include:  Extra-lean ground beef.  Arm roast.  Sirloin tip.  Center-cut ham.  Round steak.  Loin chops.  Rump roast.  Tenderloin.  Trim all fat off the outside of meats before cooking. It is not necessary to severely decrease the intake of red meat, but lean choices should be made. Lean meat is rich in protein and contains a  highly absorbable form of iron. Premenopausal women, in particular, should avoid reducing lean red meat because this could increase the risk for low red blood cells (iron-deficiency anemia). The organ meats, such as liver, sweetbreads, kidneys, and brain are very rich in cholesterol. They should be limited. Chicken and [place] These are good sources of protein. The fat of poultry can be reduced by removing the skin and underlying fat layers before cooking. Chicken and [place] can be substituted for lean red meat in the diet. Poultry should not be fried or covered with high-fat sauces. Fish and Shellfish Fish is a good source of protein. Shellfish contain cholesterol, but they usually are low in saturated fatty acids. The preparation of fish is important. Like chicken and [place], they should not be fried or covered with high-fat sauces. EGGS Egg whites contain no fat or cholesterol. They can be eaten often. Try 1 to 2 egg whites instead of whole eggs in recipes or use egg substitutes that do not contain yolk. MILK AND DAIRY PRODUCTS Use skim or 1% milk instead of 2% or whole milk. Decrease whole milk, natural, and processed cheeses. Use nonfat or low-fat (2%) cottage cheese or low-fat cheeses made from vegetable oils. Choose nonfat or low-fat (1 to 2%) yogurt. Experiment with evaporated skim milk in recipes that call for heavy cream. Substitute low-fat yogurt or low-fat cottage cheese for sour cream in dips and salad dressings. Have at least 2 servings of low-fat dairy products, such as 2 glasses of skim (or 1%) milk each day to help get your daily calcium intake.  FATS AND OILS Reduce the total intake of fats, especially saturated fat. Butterfat, lard, and beef fats are high in saturated fat and cholesterol. These should be avoided as much as possible. Vegetable fats do not contain cholesterol, but certain vegetable fats, such as coconut oil, palm oil, and palm kernel oil are very high in saturated fats.  These should be limited. These fats are often used in bakery goods, processed foods, popcorn, oils, and nondairy creamers. Vegetable shortenings and some peanut butters contain hydrogenated oils, which are also saturated fats. Read the labels on these foods and check for saturated vegetable oils. Unsaturated vegetable oils and fats do not raise blood cholesterol. However, they should be limited because they are fats and are high in calories. Total fat should still be limited to 30% of your daily caloric intake. Desirable liquid vegetable oils are corn oil, cottonseed oil, olive oil, canola oil, safflower oil, soybean oil, and sunflower oil. Peanut oil is not as good, but small amounts are acceptable. Buy a heart-healthy tub margarine that has no partially  hydrogenated oils in the ingredients. Mayonnaise and salad dressings often are made from unsaturated fats, but they should also be limited because of their high calorie and fat content. Seeds, nuts, peanut butter, olives, and avocados are high in fat, but the fat is mainly the unsaturated type. These foods should be limited mainly to avoid excess calories and fat. OTHER EATING TIPS Snacks  Most sweets should be limited as snacks. They tend to be rich in calories and fats, and their caloric content outweighs their nutritional value. Some good choices in snacks are graham crackers, melba toast, soda crackers, bagels (no egg), English muffins, fruits, and vegetables. These snacks are preferable to snack crackers, [place] fries, and chips. Popcorn should be air-popped or cooked in small amounts of liquid vegetable oil. Desserts Eat fruit, low-fat yogurt, and fruit ices. AVOID pastries, cake, and cookies. Sherbet, angel food cake, gelatin dessert, frozen low-fat yogurt, or other frozen products that do not contain saturated fat (pure fruit juice bars, frozen ice pops) are also acceptable.  COOKING METHODS Choose those methods that use little or no fat. They  include: Poaching.  Braising.  Steaming.  Grilling.  Baking.  Stir-frying.  Broiling.  Microwaving.  Foods can be cooked in a nonstick pan without added fat, or use a nonfat cooking spray in regular cookware. Limit fried foods and avoid frying in saturated fat. Add moisture to lean meats by using water, broth, cooking wines, and other nonfat or low-fat sauces along with the cooking methods mentioned above. Soups and stews should be chilled after cooking. The fat that forms on top after a few hours in the refrigerator should be skimmed off. When preparing meals, avoid using excess salt. Salt can contribute to raising blood pressure in some people. EATING AWAY FROM HOME Order entres, potatoes, and vegetables without sauces or butter. When meat exceeds the size of a deck of cards (3 to 4 ounces), the rest can be taken home for another meal. Choose vegetable or fruit salads and ask for low-calorie salad dressings to be served on the side. Use dressings sparingly. Limit high-fat toppings, such as bacon, crumbled eggs, cheese, sunflower seeds, and olives. Ask for heart-healthy tub margarine instead of butter.   Diverticulosis Diverticulosis is a common condition that develops when small pouches (diverticula) form in the wall of the colon. The risk of diverticulosis increases with age. It happens more often in people who eat a low-fiber diet. Most individuals with diverticulosis have no symptoms. Those individuals with symptoms usually experience belly (abdominal) pain, constipation, or loose stools (diarrhea).  HOME CARE INSTRUCTIONS  Increase the amount of fiber in your diet as directed by your caregiver or dietician. This may reduce symptoms of diverticulosis.   Drink at least 6 to 8 glasses of water each day to prevent constipation.   Try not to strain when you have a bowel movement.   Avoiding nuts and seeds to prevent complications is still an uncertain benefit.       FOODS HAVING  HIGH FIBER CONTENT INCLUDE:  Fruits. Apple, peach, pear, tangerine, raisins, prunes.   Vegetables. Brussels sprouts, asparagus, broccoli, cabbage, carrot, cauliflower, romaine lettuce, spinach, summer squash, tomato, winter squash, zucchini.   Starchy Vegetables. Baked beans, kidney beans, lima beans, split peas, lentils, potatoes (with skin).   Grains. Whole wheat bread, brown rice, bran flake cereal, plain oatmeal, white rice, shredded wheat, bran muffins.   Polyps, Colon  A polyp is extra tissue that grows inside your body. Colon polyps grow in  the large intestine. The large intestine, also called the colon, is part of your digestive system. It is a long, hollow tube at the end of your digestive tract where your body makes and stores stool. Most polyps are not dangerous. They are benign. This means they are not cancerous. But over time, some types of polyps can turn into cancer. Polyps that are smaller than a pea are usually not harmful. But larger polyps could someday become or may already be cancerous. To be safe, doctors remove all polyps and test them.   WHO GETS POLYPS? Anyone can get polyps, but certain people are more likely than others. You may have a greater chance of getting polyps if:  You are over 50.   You have had polyps before.   Someone in your family has had polyps.   Someone in your family has had cancer of the large intestine.   Find out if someone in your family has had polyps. You may also be more likely to get polyps if you:   Eat a lot of fatty foods   Smoke   Drink alcohol   Do not exercise  Eat too much   TREATMENT  The caregiver will remove the polyp during sigmoidoscopy or colonoscopy.  PREVENTION There is not one sure way to prevent polyps. You might be able to lower your risk of getting them if you:  Eat more fruits and vegetables and less fatty food.   Do not smoke.   Avoid alcohol.   Exercise every day.   Lose weight if you are  overweight.   Eating more calcium and folate can also lower your risk of getting polyps. Some foods that are rich in calcium are milk, cheese, and broccoli. Some foods that are rich in folate are chickpeas, kidney beans, and spinach.

## 2014-02-24 ENCOUNTER — Telehealth: Payer: Self-pay | Admitting: *Deleted

## 2014-02-24 NOTE — Telephone Encounter (Signed)
Pt called stating she had a procedure done yesterday and she has 11 pages from her d/c pt has a couple questions that she didn't understand. Pt stated she loves Dr. Oneida Alar and she has a great bedside manner. Please advise [phone]

## 2014-02-24 NOTE — Telephone Encounter (Signed)
Talked with patient and she understands the discharge instruction now.

## 2014-02-25 ENCOUNTER — Encounter (HOSPITAL_COMMUNITY): Payer: Self-pay | Admitting: Gastroenterology

## 2014-03-03 ENCOUNTER — Telehealth: Payer: Self-pay | Admitting: Gastroenterology

## 2014-03-03 NOTE — Telephone Encounter (Signed)
Reminder in epic °

## 2014-03-03 NOTE — Telephone Encounter (Signed)
No phone number listed. Mailed letter for pt to call for results.

## 2014-03-03 NOTE — Telephone Encounter (Signed)
Please call pt. She had HYPERPLASTIC POLYPS removed. Her stomach, colon, and small bowel biopsies are normal. HER SYMPTOMS OF ABD PAIN AND INTERMITTENT LOOSE/WATERY STOOL ARE MOST LIKELY DUE TO IBS OR LACTOSE INTOLERANCE.   DRINK WATER TO KEEP URINE LIGHT YELLOW. Use Prilosec 30 minutes prior to your first meal.  FOLLOW A HIGH FIBER/LOW FAT DIET. AVOID ITEMS THAT CAUSE BLOATING.   FOLLOW UP IN JUL 2015 E30 ABD PAIN, LOOSE STOOLS  Next colonoscopy in 10-15 years IF THE BENEFITS OUTWEIGH THE RISKS.Marland Kitchen

## 2014-03-03 NOTE — Telephone Encounter (Signed)
Reminder in EPIC 

## 2014-03-04 NOTE — Telephone Encounter (Signed)
Pt returned call and was informed of the results.  

## 2014-05-28 ENCOUNTER — Encounter: Payer: Self-pay | Admitting: Gastroenterology

## 2014-07-09 ENCOUNTER — Encounter: Payer: Self-pay | Admitting: Gastroenterology

## 2014-07-09 ENCOUNTER — Ambulatory Visit (INDEPENDENT_AMBULATORY_CARE_PROVIDER_SITE_OTHER): Payer: Medicare Other | Admitting: Gastroenterology

## 2014-07-09 VITALS — BP 180/98 | HR 117 | Temp 97.6°F | Ht 64.0 in | Wt 128.6 lb

## 2014-07-09 DIAGNOSIS — K3189 Other diseases of stomach and duodenum: Secondary | ICD-10-CM

## 2014-07-09 DIAGNOSIS — R1013 Epigastric pain: Secondary | ICD-10-CM

## 2014-07-09 NOTE — Assessment & Plan Note (Signed)
Sx improved.  CONTINUE OMEPRAZOLE.  Avoid triggers for reflux. Ho given. FOLLOW UP IN prn

## 2014-07-09 NOTE — Progress Notes (Signed)
   Subjective:    Patient ID: Danielle Price, female    DOB: 04/06/1941, 73 y.o.   MRN: [id number]  Vic Blackbird, MD  HPI If feels the upper abdominal pain coming on, takes a Prilosec and it goes away. ROOK PRILOSECIN JUL #11 AND AUG TOOK #11. DOESN'T WANT TO TAKE IT REGULARLY BECAUSE SHE DOESN'T WANT TO DEAL WITH THE SIDE EFFECTS. HAVING TROUBLE WITH LEFT EYE. CAN STILL SEE AT THIS POINT. ONE EPISODE OF RECTAL BLEEDING: DRIPPING IN BOWEL AFTER LETTING OUT GAS(PAINLESS) AND NOT ASSOCIATED WITH CONSTIPATION. LOOSE STOOLS: 1-2X/MONTH. Pt thinks BP IS ELEVATED DUE TO ANXIETY ABOUT DRIVING AND HAVING TO PAY $50 CO-PA[employer].  PT DENIES FEVER, CHILLS, HEMATOCHEZIA, HEMATEMESIS, nausea, vomiting, melena, diarrhea, CHEST PAIN, SHORTNESS OF BREATH,  DYSURIA, OR HEMATURIA.   Past Medical History  Diagnosis Date  . Glaucoma     Dr. Martie Round [facility]  . GERD (gastroesophageal reflux disease)   . Anemia     as a teenager  . Anxiety    Past Surgical History  Procedure Laterality Date  . Tubal ligation    . Cataract extraction    . Coccyx chipped off    . Colonoscopy N/A 02/23/2014    Procedure: COLONOSCOPY;  Surgeon: Danie Binder, MD;  Location: [facility];  Service: Endoscopy;  Laterality: N/A;  9:30 AM  . Esophagogastroduodenoscopy N/A 02/23/2014    Procedure: ESOPHAGOGASTRODUODENOSCOPY (EGD);  Surgeon: Danie Binder, MD;  Location: [facility];  Service: Endoscopy;  Laterality: N/A;   Allergies  Allergen Reactions  . Iron     Rash     Current Outpatient Prescriptions  Medication Sig Dispense Refill  . erythromycin ophthalmic ointment Place 1 application into the right eye daily as needed.      . latanoprost (XALATAN) 0.005 % ophthalmic solution Place 1 drop into both eyes at bedtime.      Marland Kitchen omeprazole (PRILOSEC) 20 MG capsule 1 po daily as needed         Review of Systems     Objective:   Physical Exam  Vitals reviewed. Constitutional: She is oriented to  person, place, and time. She appears well-developed and well-nourished. No distress.  HENT:  Head: Normocephalic and atraumatic.  Mouth/Throat: Oropharynx is clear and moist. No oropharyngeal exudate.  Eyes: Pupils are equal, round, and reactive to light. No scleral icterus.  Neck: Normal range of motion. Neck supple.  Cardiovascular: Normal rate, regular rhythm and normal heart sounds.   Pulmonary/Chest: Effort normal and breath sounds normal. No respiratory distress.  Abdominal: Soft. Bowel sounds are normal. She exhibits no distension. There is tenderness. There is no rebound and no guarding.  MILD TTP IN THE EPIGASTRIUM    Musculoskeletal: She exhibits no edema.  Lymphadenopathy:    She has no cervical adenopathy.  Neurological: She is alert and oriented to person, place, and time.  NO FOCAL DEFICITS   Psychiatric:  SLIGHTLY ANXIOUS MOOD, NL AFFECT          Assessment & Plan:

## 2014-07-09 NOTE — Patient Instructions (Signed)
Continue PRILOSEC.  AVOID FOOD THAT TRIGGER LOOSE STOOLS AND REFLUX.  PLEASE CALL WITH QUESTIONS OR CONCERNS.  FOLLOW UP AS NEEDED.    Lifestyle and home remedies You may eliminate or reduce the frequency of heartburn by making the following lifestyle changes:    Control your weight. Being overweight is a major risk factor for heartburn and GERD. Excess pounds put pressure on your abdomen, pushing up your stomach and causing acid to back up into your esophagus.     Eat smaller meals. 4 TO 6 MEALS A DAY. This reduces pressure on the lower esophageal sphincter, helping to prevent the valve from opening and acid from washing back into your esophagus.     Loosen your belt. Clothes that fit tightly around your waist put pressure on your abdomen and the lower esophageal sphincter.    Eliminate heartburn triggers. Everyone has specific triggers.     Common triggers such as fatty or fried foods, spicy food, tomato sauce, carbonated beverages, alcohol, chocolate, mint, garlic, onion, caffeine and nicotine may make heartburn worse.     Avoid stooping or bending. Tying your shoes is OK. Bending over for longer periods to weed your garden isn't, especially soon after eating.     Don't lie down after a meal. Wait at least three to four hours after eating before going to bed, and don't lie down right after eating.   Alternative medicine   Several home remedies exist for treating GERD, but they provide only temporary relief. They include drinking baking soda (sodium bicarbonate) added to water or drinking other fluids such as baking soda mixed with cream of tartar and water.   Although these liquids create temporary relief by neutralizing, washing away or buffering acids, eventually they aggravate the situation by adding gas and fluid to your stomach, increasing pressure and causing more acid reflux. Further, adding more sodium to your diet may increase your blood pressure and add stress to your heart,  and excessive bicarbonate ingestion can alter the acid-base balance in your body.

## 2014-07-09 NOTE — Progress Notes (Signed)
cc'ed to pcp °

## 2014-12-02 DIAGNOSIS — H21321 Implantation cysts of iris, ciliary body or anterior chamber, right eye: Secondary | ICD-10-CM | POA: Diagnosis not present

## 2015-01-11 ENCOUNTER — Telehealth: Payer: Self-pay | Admitting: Gastroenterology

## 2015-01-11 MED ORDER — OMEPRAZOLE 20 MG PO CPDR: DELAYED_RELEASE_CAPSULE | ORAL | Status: DC

## 2015-01-11 NOTE — Telephone Encounter (Signed)
PLEASE CALL PATIENT REGARDING PRESCRIPTION

## 2015-01-11 NOTE — Telephone Encounter (Signed)
Completed.

## 2015-01-11 NOTE — Addendum Note (Signed)
Addended by: Orvil Feil on: 01/11/2015 12:30 PM   Modules accepted: Orders

## 2015-01-11 NOTE — Telephone Encounter (Signed)
Pt will soon be out of refills on Omeprazole. Please send to pharmacy.

## 2015-07-07 DIAGNOSIS — H21321 Implantation cysts of iris, ciliary body or anterior chamber, right eye: Secondary | ICD-10-CM | POA: Diagnosis not present

## 2015-08-09 DIAGNOSIS — H1852 Epithelial (juvenile) corneal dystrophy: Secondary | ICD-10-CM | POA: Diagnosis not present

## 2015-08-09 DIAGNOSIS — H21321 Implantation cysts of iris, ciliary body or anterior chamber, right eye: Secondary | ICD-10-CM | POA: Diagnosis not present

## 2015-11-25 DIAGNOSIS — H21321 Implantation cysts of iris, ciliary body or anterior chamber, right eye: Secondary | ICD-10-CM | POA: Diagnosis not present

## 2015-11-25 DIAGNOSIS — Z961 Presence of intraocular lens: Secondary | ICD-10-CM | POA: Diagnosis not present

## 2015-11-25 DIAGNOSIS — H1852 Epithelial (juvenile) corneal dystrophy: Secondary | ICD-10-CM | POA: Diagnosis not present

## 2015-11-25 DIAGNOSIS — H2512 Age-related nuclear cataract, left eye: Secondary | ICD-10-CM | POA: Diagnosis not present

## 2016-01-05 ENCOUNTER — Emergency Department (HOSPITAL_COMMUNITY): Payer: Medicare Other

## 2016-01-05 ENCOUNTER — Encounter (HOSPITAL_COMMUNITY): Payer: Self-pay

## 2016-01-05 ENCOUNTER — Telehealth: Payer: Self-pay | Admitting: Gastroenterology

## 2016-01-05 ENCOUNTER — Emergency Department (HOSPITAL_COMMUNITY)
Admission: EM | Admit: 2016-01-05 | Discharge: 2016-01-05 | Disposition: A | Discharge status: Home / Self Care | Payer: Medicare Other | Attending: Emergency Medicine | Admitting: Emergency Medicine

## 2016-01-05 DIAGNOSIS — K8020 Calculus of gallbladder without cholecystitis without obstruction: Secondary | ICD-10-CM | POA: Diagnosis not present

## 2016-01-05 DIAGNOSIS — R109 Unspecified abdominal pain: Secondary | ICD-10-CM

## 2016-01-05 DIAGNOSIS — F17210 Nicotine dependence, cigarettes, uncomplicated: Secondary | ICD-10-CM | POA: Insufficient documentation

## 2016-01-05 DIAGNOSIS — R1011 Right upper quadrant pain: Secondary | ICD-10-CM | POA: Diagnosis not present

## 2016-01-05 DIAGNOSIS — R1010 Upper abdominal pain, unspecified: Secondary | ICD-10-CM | POA: Diagnosis not present

## 2016-01-05 LAB — URINALYSIS, ROUTINE W REFLEX MICROSCOPIC
BILIRUBIN URINE: NEGATIVE
Glucose, UA: NEGATIVE mg/dL
KETONES UR: NEGATIVE mg/dL
Nitrite: NEGATIVE
Protein, ur: NEGATIVE mg/dL
SPECIFIC GRAVITY, URINE: 1.02 (ref 1.005–1.030)
pH: 6 (ref 5.0–8.0)

## 2016-01-05 LAB — CBC WITH DIFFERENTIAL/PLATELET
BASOS PCT: 0 %
Basophils Absolute: 0 10*3/uL (ref 0.0–0.1)
EOS PCT: 0 %
Eosinophils Absolute: 0 10*3/uL (ref 0.0–0.7)
HCT: 41.1 % (ref 36.0–46.0)
HEMOGLOBIN: 13.4 g/dL (ref 12.0–15.0)
Lymphocytes Relative: 17 %
Lymphs Abs: 1.3 10*3/uL (ref 0.7–4.0)
MCH: 30 pg (ref 26.0–34.0)
MCHC: 32.6 g/dL (ref 30.0–36.0)
MCV: 92.2 fL (ref 78.0–100.0)
MONO ABS: 0.5 10*3/uL (ref 0.1–1.0)
MONOS PCT: 8 %
NEUTROS PCT: 75 %
Neutro Abs: 5.4 10*3/uL (ref 1.7–7.7)
Platelets: 288 10*3/uL (ref 150–400)
RBC: 4.46 MIL/uL (ref 3.87–5.11)
RDW: 12.5 % (ref 11.5–15.5)
WBC: 7.2 10*3/uL (ref 4.0–10.5)

## 2016-01-05 LAB — COMPREHENSIVE METABOLIC PANEL
ALK PHOS: 99 U/L (ref 38–126)
ALT: 62 U/L — ABNORMAL HIGH (ref 14–54)
AST: 180 U/L — ABNORMAL HIGH (ref 15–41)
Albumin: 4.3 g/dL (ref 3.5–5.0)
Anion gap: 8 (ref 5–15)
BILIRUBIN TOTAL: 0.7 mg/dL (ref 0.3–1.2)
BUN: 15 mg/dL (ref 6–20)
CALCIUM: 8.8 mg/dL — AB (ref 8.9–10.3)
CO2: 26 mmol/L (ref 22–32)
CREATININE: 0.87 mg/dL (ref 0.44–1.00)
Chloride: 106 mmol/L (ref 101–111)
GFR calc Af Amer: >60 mL/min (ref >60)
Glucose, Bld: 161 mg/dL — ABNORMAL HIGH (ref 65–99)
Potassium: 3.9 mmol/L (ref 3.5–5.1)
Sodium: 140 mmol/L (ref 135–145)
TOTAL PROTEIN: 6.9 g/dL (ref 6.5–8.1)

## 2016-01-05 LAB — URINE MICROSCOPIC-ADD ON

## 2016-01-05 LAB — LIPASE, BLOOD: Lipase: 25 U/L (ref 11–51)

## 2016-01-05 MED ORDER — SODIUM CHLORIDE 0.9 % IV BOLUS (SEPSIS)
1000.0000 mL | Freq: Once | INTRAVENOUS | Status: AC
  Administered 2016-01-05: 1000 mL via INTRAVENOUS

## 2016-01-05 MED ORDER — IOHEXOL 300 MG/ML  SOLN
100.0000 mL | Freq: Once | INTRAMUSCULAR | Status: AC | PRN
  Administered 2016-01-05: 100 mL via INTRAVENOUS

## 2016-01-05 MED ORDER — OMEPRAZOLE 20 MG PO CPDR: DELAYED_RELEASE_CAPSULE | ORAL | Status: AC

## 2016-01-05 MED ORDER — FENTANYL CITRATE (PF) 100 MCG/2ML IJ SOLN
100.0000 ug | Freq: Once | INTRAMUSCULAR | Status: AC
  Administered 2016-01-05: 100 ug via INTRAVENOUS
  Filled 2016-01-05: qty 2

## 2016-01-05 MED ORDER — GI COCKTAIL ~~LOC~~
30.0000 mL | Freq: Once | ORAL | Status: AC
  Administered 2016-01-05: 30 mL via ORAL
  Filled 2016-01-05: qty 30

## 2016-01-05 MED ORDER — ONDANSETRON HCL 4 MG/2ML IJ SOLN
4.0000 mg | Freq: Once | INTRAMUSCULAR | Status: AC
Start: 2016-01-05 — End: 2016-01-05
  Administered 2016-01-05: 4 mg via INTRAVENOUS
  Filled 2016-01-05: qty 2

## 2016-01-05 NOTE — Telephone Encounter (Signed)
Pt called saying that she was having another attack and the pain level was unbearable. She was asking if SF was here and I told her she wasn't in the office today. I offered her OV with extender in the morning, but patient said that she could not wait that long and was going to call the ambulance. She stated that she was having pressure in her chest and radiating down her arm. She said that this has happened before. I told her if her pain level was that bad that she needed to go to the ER and if GI consult was needed they ED would contact whomever was on call. From the signs and symptoms she was telling me I was worried that it was cardiac related.

## 2016-01-05 NOTE — ED Notes (Signed)
EMS reports patient with RUQ pain, radiation into chest, right upper back, onset 115 after eating [place], pt has DM. Hx of gallstones.

## 2016-01-05 NOTE — Telephone Encounter (Signed)
Noted. Work up in process at ED.

## 2016-01-05 NOTE — Telephone Encounter (Signed)
Routing to Leslie Lewis, PA for FYI.  

## 2016-01-05 NOTE — Telephone Encounter (Signed)
Tried to call pt and line was busy.  

## 2016-01-05 NOTE — Telephone Encounter (Signed)
I called and spoke to pt and she said EMS is on the way. She has had some relief of her symptoms but is going on to the hospital.

## 2016-01-05 NOTE — ED Provider Notes (Signed)
CSN: [id number]     Arrival date & time 01/05/16  1514 History   First MD Initiated Contact with Patient 01/05/16 1516     Chief Complaint  Patient presents with  . Abdominal Pain     (Consider location/radiation/quality/duration/timing/severity/associated sxs/prior Treatment) Patient is a 75 y.o. female presenting with abdominal pain.  Abdominal Pain Pain location:  RUQ Pain quality: aching   Pain radiates to:  Does not radiate Pain severity:  Mild Onset quality:  Gradual Timing:  Constant Progression:  Worsening Chronicity:  Recurrent Relieved by:  None tried Worsened by:  Nothing tried Ineffective treatments:  None tried Associated symptoms: nausea   Associated symptoms: no anorexia, no belching, no fever and no vomiting     Past Medical History  Diagnosis Date  . Glaucoma     Dr. Martie Round [facility]  . GERD (gastroesophageal reflux disease)   . Anemia     as a teenager  . Anxiety    Past Surgical History  Procedure Laterality Date  . Tubal ligation    . Cataract extraction    . Coccyx chipped off    . Colonoscopy N/A 02/23/2014    Procedure: COLONOSCOPY;  Surgeon: Danie Binder, MD;  Location: [facility];  Service: Endoscopy;  Laterality: N/A;  9:30 AM  . Esophagogastroduodenoscopy N/A 02/23/2014    Procedure: ESOPHAGOGASTRODUODENOSCOPY (EGD);  Surgeon: Danie Binder, MD;  Location: [facility];  Service: Endoscopy;  Laterality: N/A;   Family History  Problem Relation Age of Onset  . Heart disease Mother   . Cancer Father     prostate   . COPD Father   . Cancer Brother     prostate  . Hyperlipidemia Brother   . Hyperlipidemia Brother   . Hypertension Brother   . Colon cancer Neg Hx   . Colon polyps Brother     had to have colectomy   Social History  Substance Use Topics  . Smoking status: Current Every Day Smoker -- 0.50 packs/day for 35 years    Types: Cigarettes  . Smokeless tobacco: None     Comment: 7 or 8 cigarettes a day  .  Alcohol Use: No   OB History    No data available     Review of Systems  Constitutional: Negative for fever.  Eyes: Negative for pain.  Gastrointestinal: Positive for nausea and abdominal pain. Negative for vomiting and anorexia.  Genitourinary: Negative for frequency, enuresis and genital sores.  All other systems reviewed and are negative.     Allergies  Iron  Home Medications   Prior to Admission medications   Medication Sig Start Date End Date Taking? Authorizing Provider  erythromycin ophthalmic ointment Place 1 application into the right eye daily.    Yes Historical Provider, MD  omeprazole (PRILOSEC) 20 MG capsule 1 po 30 minutes before breakfast 01/05/16   Merrily Pew, MD   BP 140/82 mmHg  Pulse 74  Temp(Src) 98.4 F (36.9 C) (Oral)  Resp 18  Ht 5\' 4"  (1.626 m)  Wt 128 lb (58.06 kg)  BMI 21.96 kg/m2  SpO2 100% Physical Exam  Constitutional: She is oriented to person, place, and time. She appears well-developed and well-nourished.  HENT:  Head: Normocephalic and atraumatic.  Neck: Normal range of motion.  Cardiovascular: Normal rate and regular rhythm.   Pulmonary/Chest: No stridor. No respiratory distress.  Abdominal: She exhibits no distension. There is tenderness (minimally). There is no rebound and no guarding.  Musculoskeletal: Normal range  of motion. She exhibits no edema or tenderness.  Neurological: She is alert and oriented to person, place, and time. No cranial nerve deficit. Coordination normal.  Skin: Skin is warm and dry. No rash noted. No erythema.  Nursing note and vitals reviewed.   ED Course  Procedures (including critical care time) Labs Review Labs Reviewed  COMPREHENSIVE METABOLIC PANEL - Abnormal; Notable for the following:    Glucose, Bld 161 (*)    Calcium 8.8 (*)    AST 180 (*)    ALT 62 (*)    All other components within normal limits  URINALYSIS, ROUTINE W REFLEX MICROSCOPIC (NOT AT [facility]) - Abnormal; Notable for the following:     APPearance HAZY (*)    Hgb urine dipstick TRACE (*)    Leukocytes, UA SMALL (*)    All other components within normal limits  URINE MICROSCOPIC-ADD ON - Abnormal; Notable for the following:    Squamous Epithelial / LPF TOO NUMEROUS TO COUNT (*)    Bacteria, UA MANY (*)    All other components within normal limits  LIPASE, BLOOD  CBC WITH DIFFERENTIAL/PLATELET    Imaging Review Ct Abdomen Pelvis W Contrast  01/05/2016  CLINICAL DATA:  Right upper quadrant pain radiating into the chest and right upper back beginning at 1:15 today after eating. History of gallstones. Initial encounter. EXAM: CT ABDOMEN AND PELVIS WITH CONTRAST TECHNIQUE: Multidetector CT imaging of the abdomen and pelvis was performed using the standard protocol following bolus administration of intravenous contrast. CONTRAST:  100 ml OMNIPAQUE IOHEXOL 300 MG/ML  SOLN COMPARISON:  Right upper quadrant ultrasound this same day. FINDINGS: The lung bases are clear.  No pleural pericardial effusion. A hemangioma measuring 1.9 cm is seen in the right hepatic lobe. A second hemangioma is seen in left hepatic lobe measuring 1.2 cm on image 25 A small low attenuating lesion the right hepatic lobe on image 17 cannot be definitively characterized and could be a hemangioma or cyst. The liver is low attenuating consistent with fatty infiltration. Multiple stones are identified in the gallbladder. The gallbladder wall appears slightly thickened but no stranding about the gallbladder is identified. There is no biliary ductal dilatation. The spleen, adrenal glands, pancreas and left kidney are unremarkable. 0.5 cm in diameter angiomyolipoma right kidney is noted. Aortoiliac atherosclerosis without aneurysm is identified. Uterus, adnexa and urinary bladder are unremarkable. Diverticulosis is most notable in the sigmoid colon but there is no evidence of diverticulitis. The colon otherwise appears normal. The stomach, small bowel and appendix are  unremarkable. There is no lymphadenopathy or fluid collection. No focal bony abnormality is identified. IMPRESSION: Gallstones without evidence of cholecystitis. Fatty infiltration of the liver. Atherosclerosis. Diverticulosis without diverticulitis. 0.5 cm angiomyolipoma right kidney. Electronically Signed   By: Inge Rise M.D.   On: 01/05/2016 19:06   US Abdomen Limited  01/05/2016  CLINICAL DATA:  Right upper quadrant pain EXAM: US ABDOMEN LIMITED - RIGHT UPPER QUADRANT COMPARISON:  01/14/2014 FINDINGS: Gallbladder: Well distended with multiple gallstones. No wall thickening is noted. A negative sonographic Percell Miller sign is seen. Common bile duct: Diameter: 4 mm. Liver: 2.1 cm hemangioma in the liver similar to that seen on prior MRI. IMPRESSION: Cholelithiasis without complicating factors. Stable hepatic hemangioma. Electronically Signed   By: Inez Catalina M.D.   On: 01/05/2016 16:36   I have personally reviewed and evaluated these images and lab results as part of my medical decision-making.   EKG Interpretation None  MDM   Final diagnoses:  Abdominal pain, unspecified abdominal location   Likely gastritis. Ultrasound done to evaluate for cholecystitis and she has a history of gallstones and this was negative. CT scan done to evaluate for any pancreatitis, colitis and enteritis or diverticulitis or bowel obstruction and this is negative as well. Patient's symptoms still slightly present time and discharged much improved with a GI cocktail. Patient stable for discharge. Need to follow-up with her primary doctor.     Merrily Pew, MD 01/07/16 0830

## 2016-01-06 ENCOUNTER — Telehealth: Payer: Self-pay

## 2016-01-06 NOTE — Telephone Encounter (Signed)
Pt is calling to get an appointment with [facility]. She only wants to [facility]. SHe had to go to the ER last night for abd pain. They done some x-ray and blood work. She would like for [facility] to look over the test that the ER done. She has a lot of questions that is why she only wants to see [facility]. I have her an appointment made for April 12 @ 2:00 pm with [facility].

## 2016-01-11 NOTE — Telephone Encounter (Signed)
LABS AND CT REVIEWED FROM [facility] 2017: NAIAP. ELEVATED LIVER ENZYMES MOST LIKELY DUE TO NASH. OPV APR 2017.

## 2016-02-09 ENCOUNTER — Ambulatory Visit: Payer: Medicare Other | Admitting: Gastroenterology

## 2016-05-25 DIAGNOSIS — Z961 Presence of intraocular lens: Secondary | ICD-10-CM | POA: Diagnosis not present

## 2016-05-25 DIAGNOSIS — H1852 Epithelial (juvenile) corneal dystrophy: Secondary | ICD-10-CM | POA: Diagnosis not present

## 2016-05-25 DIAGNOSIS — H21321 Implantation cysts of iris, ciliary body or anterior chamber, right eye: Secondary | ICD-10-CM | POA: Diagnosis not present

## 2016-05-25 DIAGNOSIS — H2512 Age-related nuclear cataract, left eye: Secondary | ICD-10-CM | POA: Diagnosis not present

## 2016-06-22 DIAGNOSIS — H40013 Open angle with borderline findings, low risk, bilateral: Secondary | ICD-10-CM | POA: Diagnosis not present

## 2017-01-18 DIAGNOSIS — H40013 Open angle with borderline findings, low risk, bilateral: Secondary | ICD-10-CM | POA: Diagnosis not present

## 2018-01-17 DIAGNOSIS — H40013 Open angle with borderline findings, low risk, bilateral: Secondary | ICD-10-CM | POA: Diagnosis not present

## 2018-06-06 ENCOUNTER — Emergency Department (HOSPITAL_COMMUNITY): Payer: Medicare Other

## 2018-06-06 ENCOUNTER — Emergency Department (HOSPITAL_COMMUNITY)
Admission: EM | Admit: 2018-06-06 | Discharge: 2018-06-06 | Disposition: A | Discharge status: Home / Self Care | Payer: Medicare Other | Attending: Emergency Medicine | Admitting: Emergency Medicine

## 2018-06-06 ENCOUNTER — Other Ambulatory Visit: Payer: Self-pay

## 2018-06-06 ENCOUNTER — Encounter (HOSPITAL_COMMUNITY): Payer: Self-pay | Admitting: Emergency Medicine

## 2018-06-06 DIAGNOSIS — R945 Abnormal results of liver function studies: Secondary | ICD-10-CM | POA: Insufficient documentation

## 2018-06-06 DIAGNOSIS — F17210 Nicotine dependence, cigarettes, uncomplicated: Secondary | ICD-10-CM | POA: Diagnosis not present

## 2018-06-06 DIAGNOSIS — R1010 Upper abdominal pain, unspecified: Secondary | ICD-10-CM | POA: Diagnosis not present

## 2018-06-06 DIAGNOSIS — R7989 Other specified abnormal findings of blood chemistry: Secondary | ICD-10-CM

## 2018-06-06 DIAGNOSIS — R110 Nausea: Secondary | ICD-10-CM | POA: Diagnosis not present

## 2018-06-06 DIAGNOSIS — R1011 Right upper quadrant pain: Secondary | ICD-10-CM | POA: Diagnosis not present

## 2018-06-06 DIAGNOSIS — R1084 Generalized abdominal pain: Secondary | ICD-10-CM | POA: Diagnosis not present

## 2018-06-06 DIAGNOSIS — K8020 Calculus of gallbladder without cholecystitis without obstruction: Secondary | ICD-10-CM | POA: Diagnosis not present

## 2018-06-06 DIAGNOSIS — R079 Chest pain, unspecified: Secondary | ICD-10-CM | POA: Diagnosis not present

## 2018-06-06 DIAGNOSIS — R52 Pain, unspecified: Secondary | ICD-10-CM | POA: Diagnosis not present

## 2018-06-06 DIAGNOSIS — I10 Essential (primary) hypertension: Secondary | ICD-10-CM | POA: Diagnosis not present

## 2018-06-06 LAB — URINALYSIS, ROUTINE W REFLEX MICROSCOPIC
BILIRUBIN URINE: NEGATIVE
Glucose, UA: NEGATIVE mg/dL
Hgb urine dipstick: NEGATIVE
KETONES UR: NEGATIVE mg/dL
Leukocytes, UA: NEGATIVE
NITRITE: NEGATIVE
PROTEIN: NEGATIVE mg/dL
Specific Gravity, Urine: 1.012 (ref 1.005–1.030)
pH: 9 — ABNORMAL HIGH (ref 5.0–8.0)

## 2018-06-06 LAB — COMPREHENSIVE METABOLIC PANEL
ALBUMIN: 4.2 g/dL (ref 3.5–5.0)
ALT: 77 U/L — ABNORMAL HIGH (ref 0–44)
ANION GAP: 10 (ref 5–15)
AST: 231 U/L — ABNORMAL HIGH (ref 15–41)
Alkaline Phosphatase: 92 U/L (ref 38–126)
BUN: 16 mg/dL (ref 8–23)
CALCIUM: 8.8 mg/dL — AB (ref 8.9–10.3)
CO2: 23 mmol/L (ref 22–32)
Chloride: 106 mmol/L (ref 98–111)
Creatinine, Ser: 0.89 mg/dL (ref 0.44–1.00)
GFR calc Af Amer: >60 mL/min (ref >60)
GFR calc non Af Amer: >60 mL/min (ref >60)
GLUCOSE: 114 mg/dL — AB (ref 70–99)
Potassium: 3.7 mmol/L (ref 3.5–5.1)
Sodium: 139 mmol/L (ref 135–145)
Total Bilirubin: 1.2 mg/dL (ref 0.3–1.2)
Total Protein: 6.9 g/dL (ref 6.5–8.1)

## 2018-06-06 LAB — CBC
HCT: 40.2 % (ref 36.0–46.0)
HEMOGLOBIN: 13.2 g/dL (ref 12.0–15.0)
MCH: 30.8 pg (ref 26.0–34.0)
MCHC: 32.8 g/dL (ref 30.0–36.0)
MCV: 93.7 fL (ref 78.0–100.0)
PLATELETS: 296 10*3/uL (ref 150–400)
RBC: 4.29 MIL/uL (ref 3.87–5.11)
RDW: 13 % (ref 11.5–15.5)
WBC: 7.7 10*3/uL (ref 4.0–10.5)

## 2018-06-06 LAB — TROPONIN I

## 2018-06-06 LAB — LIPASE, BLOOD: Lipase: 26 U/L (ref 11–51)

## 2018-06-06 MED ORDER — TRAMADOL HCL 50 MG PO TABS: 50.0000 mg | ORAL_TABLET | Freq: Four times a day (QID) | ORAL | 0 refills | Status: DC | PRN

## 2018-06-06 MED ORDER — ONDANSETRON 4 MG PO TBDP: 4.0000 mg | ORAL_TABLET | Freq: Three times a day (TID) | ORAL | 0 refills | Status: DC | PRN

## 2018-06-06 MED ORDER — MORPHINE SULFATE (PF) 4 MG/ML IV SOLN
2.0000 mg | Freq: Once | INTRAVENOUS | Status: AC
  Administered 2018-06-06: 2 mg via INTRAVENOUS
  Filled 2018-06-06: qty 1

## 2018-06-06 MED ORDER — SUCRALFATE 1 GM/10ML PO SUSP
1.0000 g | Freq: Three times a day (TID) | ORAL | 0 refills | Status: DC
Start: 2018-06-06 — End: 2018-06-18

## 2018-06-06 MED ORDER — ONDANSETRON HCL 4 MG/2ML IJ SOLN
4.0000 mg | Freq: Once | INTRAMUSCULAR | Status: AC
  Administered 2018-06-06: 4 mg via INTRAVENOUS
  Filled 2018-06-06: qty 2

## 2018-06-06 MED ORDER — IOPAMIDOL (ISOVUE-300) INJECTION 61%
100.0000 mL | Freq: Once | INTRAVENOUS | Status: AC | PRN
  Administered 2018-06-06: 100 mL via INTRAVENOUS

## 2018-06-06 NOTE — ED Notes (Signed)
Phlebotomy at bedside.

## 2018-06-06 NOTE — ED Triage Notes (Signed)
Pt c/o of sudden onset of LT upper abdominal pain that radiates RUQ that began approx 40 minutes. States she was eating soup and and a hotdog prior to onset of pain. Hx of gallstones, but still has gallbladder. Denies n/v/d.

## 2018-06-06 NOTE — ED Provider Notes (Signed)
Emergency Department Provider Note   I have reviewed the triage vital signs and the nursing notes.   HISTORY  Chief Complaint Abdominal Pain   HPI Danielle Price is a 77 y.o. female with PMH of GERD and anxiety presents to the emergency department for evaluation of acute onset epigastric and right upper quadrant pain.  Symptoms began after eating.  Patient describes left upper quadrant pain which feels like a pressure under her ribs and then rapidly progresses to become right upper quadrant discomfort.  She is feeling primarily right-sided pain at the time of my evaluation.  She intermittently feels nauseated without vomiting.  No diarrhea.  Denies specific shortness of breath or upper chest pain.  She states she has had this pain in the past and has been seen in the emergency department in the gastroenterology office with no clear diagnosis.    Past Medical History:  Diagnosis Date  . Anemia    as a teenager  . Anxiety   . GERD (gastroesophageal reflux disease)   . Glaucoma    Dr. Martie Round [facility]    Patient Active Problem List   Diagnosis Date Noted  . Dyspepsia 12/11/2013  . Encounter for screening colonoscopy 12/11/2013  . Elevated blood sugar 06/20/2012  . Elevated BP 05/23/2012  . Diplopia 05/23/2012  . Tobacco user 05/23/2012  . Anemia 05/23/2012    Past Surgical History:  Procedure Laterality Date  . CATARACT EXTRACTION    . Coccyx chipped off    . COLONOSCOPY N/A 02/23/2014   Procedure: COLONOSCOPY;  Surgeon: Danie Binder, MD;  Location: [facility];  Service: Endoscopy;  Laterality: N/A;  9:30 AM  . ESOPHAGOGASTRODUODENOSCOPY N/A 02/23/2014   Procedure: ESOPHAGOGASTRODUODENOSCOPY (EGD);  Surgeon: Danie Binder, MD;  Location: [facility];  Service: Endoscopy;  Laterality: N/A;  . TUBAL LIGATION      Allergies Iron  Family History  Problem Relation Age of Onset  . Heart disease Mother   . Cancer Father        prostate   . COPD  Father   . Cancer Brother        prostate  . Hyperlipidemia Brother   . Hyperlipidemia Brother   . Hypertension Brother   . Colon polyps Brother        had to have colectomy  . Colon cancer Neg Hx     Social History Social History   Tobacco Use  . Smoking status: Current Every Day Smoker    Packs/day: 0.50    Years: 35.00    Pack years: 17.50    Types: Cigarettes  . Smokeless tobacco: Never Used  . Tobacco comment: 7 or 8 cigarettes a day  Substance Use Topics  . Alcohol use: No  . Drug use: No    Review of Systems  Constitutional: No fever/chills Eyes: No visual changes. ENT: No sore throat. Cardiovascular: Positive lower chest pain. Respiratory: Denies shortness of breath. Gastrointestinal: Positive abdominal pain.  Positive intermittent nausea, no vomiting.  No diarrhea.  No constipation. Genitourinary: Negative for dysuria. Musculoskeletal: Negative for back pain. Skin: Negative for rash. Neurological: Negative for headaches, focal weakness or numbness.  10-point ROS otherwise negative.  ____________________________________________   PHYSICAL EXAM:  VITAL SIGNS: ED Triage Vitals  Enc Vitals Group     BP 06/06/18 1603 (!) 184/89     Pulse Rate 06/06/18 1603 82     Resp 06/06/18 1603 20     Temp 06/06/18 1603 98.1  F (36.7 C)     Temp Source 06/06/18 1603 Oral     SpO2 06/06/18 1603 100 %     Weight 06/06/18 1604 130 lb (59 kg)     Height 06/06/18 1604 5\' 4"  (1.626 m)   Constitutional: Alert and oriented. Patient appears moderately uncomfortable.  Eyes: Conjunctivae are normal. Head: Atraumatic. Nose: No congestion/rhinnorhea. Mouth/Throat: Mucous membranes are moist. Neck: No stridor.  Cardiovascular: Normal rate, regular rhythm. Good peripheral circulation. Grossly normal heart sounds.   Respiratory: Normal respiratory effort.  No retractions. Lungs CTAB. Gastrointestinal: Soft with epigastric and RUQ tenderness on exam. No rebound or guarding.  No distention.  Musculoskeletal: No lower extremity tenderness nor edema. No gross deformities of extremities. Neurologic:  Normal speech and language. No gross focal neurologic deficits are appreciated.  Skin:  Skin is warm, dry and intact. No rash noted.  ____________________________________________   LABS (all labs ordered are listed, but only abnormal results are displayed)  Labs Reviewed  COMPREHENSIVE METABOLIC PANEL - Abnormal; Notable for the following components:      Result Value   Glucose, Bld 114 (*)    Calcium 8.8 (*)    AST 231 (*)    ALT 77 (*)    All other components within normal limits  URINALYSIS, ROUTINE W REFLEX MICROSCOPIC - Abnormal; Notable for the following components:   pH 9.0 (*)    All other components within normal limits  LIPASE, BLOOD  CBC  TROPONIN I   ____________________________________________  EKG   EKG Interpretation  Date/Time:  Thursday June 06 2018 17:13:02 EDT Ventricular Rate:  65 PR Interval:    QRS Duration: 100 QT Interval:  418 QTC Calculation: 435 R Axis:   71 Text Interpretation:  Sinus rhythm Atrial premature complex RSR' in V1 or V2, probably normal variant No STEMI.  Confirmed by Nanda Quinton [phone]) on 06/06/2018 5:35:15 PM      ____________________________________________  RADIOLOGY  Ct Abdomen Pelvis W Contrast  Result Date: 06/06/2018 CLINICAL DATA:  Sudden onset upper abdominal pain after eating supine hot dog. EXAM: CT ABDOMEN AND PELVIS WITH CONTRAST TECHNIQUE: Multidetector CT imaging of the abdomen and pelvis was performed using the standard protocol following bolus administration of intravenous contrast. CONTRAST:  127mL ISOVUE-300 IOPAMIDOL (ISOVUE-300) INJECTION 61% COMPARISON:  Right upper quadrant ultrasound from same day. CT abdomen pelvis dated January 05, 2016. FINDINGS: Lower chest: No acute abnormality.  Mild emphysema. Hepatobiliary: Unchanged 2.0 cm hemangioma in the right hepatic lobe. Unchanged 1.3  cm hemangioma in the left hepatic lobe. Cholelithiasis. No gallbladder wall thickening or pericholecystic fluid. No biliary dilatation. Pancreas: Unremarkable. No pancreatic ductal dilatation or surrounding inflammatory changes. Spleen: Normal in size without focal abnormality. Adrenals/Urinary Tract: The adrenal glands are unremarkable. Subcentimeter low-density lesions in both kidneys remain too small to characterize but are unchanged. No renal or ureteral calculi. No hydronephrosis. The bladder is unremarkable. Stomach/Bowel: Stomach is within normal limits. Appendix appears normal. No evidence of bowel wall thickening, distention, or inflammatory changes. Moderate left-sided colonic diverticulosis. Vascular/Lymphatic: Aortic atherosclerosis. No enlarged abdominal or pelvic lymph nodes. Reproductive: Uterus and bilateral adnexa are unremarkable. Other: No free fluid or pneumoperitoneum. Musculoskeletal: No acute or significant osseous findings. IMPRESSION: 1. Cholelithiasis without evidence of acute cholecystitis. 2.  Aortic atherosclerosis (ICD10-I70.0). Electronically Signed   By: Titus Dubin M.D.   On: 06/06/2018 20:13   Dg Abdomen Acute W/chest  Result Date: 06/06/2018 CLINICAL DATA:  sudden onset of LT upper abdominal pain that radiates RUQ that  began approx 40 minutes. EXAM: DG ABDOMEN ACUTE W/ 1V CHEST COMPARISON:  None. FINDINGS: Single-view of the chest: Heart size and mediastinal contours are within normal limits. Lungs appear hyperexpanded suggesting COPD. Coarse lung markings noted bilaterally suggesting associated chronic bronchitic change and/or chronic interstitial lung disease. No confluent opacity to suggest a developing pneumonia. No pleural effusion or pneumothorax seen. Supine and upright views of the abdomen: Bowel gas pattern is nonobstructive. No evidence of soft tissue mass or abnormal fluid collection. No evidence of free intraperitoneal air. No evidence of renal or ureteral  calculi seen. Scattered calcifications in the pelvis are likely vascular phleboliths. Osseous structures of the abdomen and pelvis are unremarkable. IMPRESSION: 1. No evidence of acute cardiopulmonary abnormality. No evidence of pneumonia or pulmonary edema. 2. Hyperexpanded lungs indicating COPD. Probable associated chronic bronchitic change and/or chronic interstitial lung disease. 3. No evidence of acute intra-abdominal abnormality. Nonobstructive bowel gas pattern. Electronically Signed   By: Franki Cabot M.D.   On: 06/06/2018 17:17   US Abdomen Limited Ruq  Result Date: 06/06/2018 CLINICAL DATA:  77 year old female with right upper quadrant pain. Initial encounter. EXAM: ULTRASOUND ABDOMEN LIMITED RIGHT UPPER QUADRANT COMPARISON:  01/05/2016 CT and ultrasound. FINDINGS: Gallbladder: Multiple gallstones largest measuring up to 10.7 mm. Gallbladder wall thickening. Patient not tender over the gallbladder during scanning per sonographer. Common bile duct: Diameter: 1.8 mm. Liver: Echogenicity within normal limits. Posterior right lobe liver 1.4 x 1.7 x 1 cm echogenic lesion consistent with hemangioma as previously noted. Portal vein is patent on color Doppler imaging with normal direction of blood flow towards the liver. IMPRESSION: 1. Multiple gallstones largest measuring up to 10.7 mm. Gallbladder wall thickening. Per sonographer, patient was not tender over the gallbladder during scanning. Clinical correlation recommended. 2. Hemangioma posterior right lobe liver appears similar to prior exams. These results will be called to the ordering clinician or representative by the Radiologist Assistant, and communication documented in the PACS or zVision Dashboard. Electronically Signed   By: Genia Del M.D.   On: 06/06/2018 17:20    ____________________________________________   PROCEDURES  Procedure(s) performed:   Procedures  None ____________________________________________   INITIAL  IMPRESSION / ASSESSMENT AND PLAN / ED COURSE  Pertinent labs & imaging results that were available during my care of the patient were reviewed by me and considered in my medical decision making (see chart for details).  She presents to the emergency department for evaluation of epigastric abdominal pain with intermittent nausea that began abruptly after eating today.  She has ultrasound and CT from 2017 showing cholelithiasis without a cystitis.  Apparently has followed with gastroenterology but unclear what exact work-up has been done to this point.  Given her history and exam I have some concern for gallbladder etiology of pain.  Plan to also obtain troponin and EKG along with plain film of the chest and abdomen.  Patient will go for right upper quadrant ultrasound and reassess.  EKG reviewed. Labs normal. CT and RUQ [place] reviewed. Pain improved in the ED. No acute cholecystitis. Advised close GI and Gen Surgery follow up as an outpatient.   At this time, I do not feel there is any life-threatening condition present. I have reviewed and discussed all results (EKG, imaging, lab, urine as appropriate), exam findings with patient. I have reviewed nursing notes and appropriate previous records.  I feel the patient is safe to be discharged home without further emergent workup. Discussed usual and customary return precautions. Patient and family (if  present) verbalize understanding and are comfortable with this plan.  Patient will follow-up with their primary care provider. If they do not have a primary care provider, information for follow-up has been provided to them. All questions have been answered.  ____________________________________________  FINAL CLINICAL IMPRESSION(S) / ED DIAGNOSES  Final diagnoses:  RUQ abdominal pain  Elevated LFTs     MEDICATIONS GIVEN DURING THIS VISIT:  Medications  morphine 4 MG/ML injection 2 mg (2 mg Intravenous Given 06/06/18 1714)  ondansetron (ZOFRAN) injection  4 mg (4 mg Intravenous Given 06/06/18 1714)  iopamidol (ISOVUE-300) 61 % injection 100 mL (100 mLs Intravenous Contrast Given 06/06/18 1939)     NEW OUTPATIENT MEDICATIONS STARTED DURING THIS VISIT:  Discharge Medication List as of 06/06/2018  8:23 PM    START taking these medications   Details  ondansetron (ZOFRAN ODT) 4 MG disintegrating tablet Take 1 tablet (4 mg total) by mouth every 8 (eight) hours as needed for nausea or vomiting., Starting Thu 06/06/2018, Print    sucralfate (CARAFATE) 1 GM/10ML suspension Take 10 mLs (1 g total) by mouth 4 (four) times daily -  with meals and at bedtime., Starting Thu 06/06/2018, Print    traMADol (ULTRAM) 50 MG tablet Take 1 tablet (50 mg total) by mouth every 6 (six) hours as needed for moderate pain or severe pain., Starting Thu 06/06/2018, Print        Note:  This document was prepared using Dragon voice recognition software and may include unintentional dictation errors.  Nanda Quinton, MD Emergency Medicine    Devanee Pomplun, [facility], MD 06/07/18 [phone]

## 2018-06-06 NOTE — ED Notes (Signed)
Pt transported to US

## 2018-06-06 NOTE — Discharge Instructions (Addendum)
You were seen in the ED today with abdominal pain and nausea. You have some stones in the gallbladder which may be causing some pain. Call the surgeon, Dr. Constance Haw, tomorrow to schedule an outpatient appointment. I have also listed the name of a gastroenterologist to call for follow up. Your liver enzymes are slightly elevated. They were the last time we saw you as well but this will need to be evaluated.

## 2018-06-06 NOTE — ED Notes (Signed)
EDP at bedside  

## 2018-06-18 ENCOUNTER — Ambulatory Visit: Payer: Medicare Other | Admitting: General Surgery

## 2018-06-18 ENCOUNTER — Encounter: Payer: Self-pay | Admitting: General Surgery

## 2018-06-18 VITALS — BP 173/94 | HR 93 | Temp 98.0°F | Resp 18 | Wt 125.0 lb

## 2018-06-18 DIAGNOSIS — K8020 Calculus of gallbladder without cholecystitis without obstruction: Secondary | ICD-10-CM

## 2018-06-18 NOTE — Patient Instructions (Signed)
Laparoscopic Cholecystectomy Laparoscopic cholecystectomy is surgery to remove the gallbladder. The gallbladder is a pear-shaped organ that lies beneath the liver on the right side of the body. The gallbladder stores bile, which is a fluid that helps the body to digest fats. Cholecystectomy is often done for inflammation of the gallbladder (cholecystitis). This condition is usually caused by a buildup of gallstones (cholelithiasis) in the gallbladder. Gallstones can block the flow of bile, which can result in inflammation and pain. In severe cases, emergency surgery may be required. This procedure is done though small incisions in your abdomen (laparoscopic surgery). A thin scope with a camera (laparoscope) is inserted through one incision. Thin surgical instruments are inserted through the other incisions. In some cases, a laparoscopic procedure may be turned into a type of surgery that is done through a larger incision (open surgery). Tell a health care provider about:  Any allergies you have.  All medicines you are taking, including vitamins, herbs, eye drops, creams, and over-the-counter medicines.  Any problems you or family members have had with anesthetic medicines.  Any blood disorders you have.  Any surgeries you have had.  Any medical conditions you have.  Whether you are pregnant or may be pregnant. What are the risks? Generally, this is a safe procedure. However, problems may occur, including:  Infection.  Bleeding.  Allergic reactions to medicines.  Damage to other structures or organs.  A stone remaining in the common bile duct. The common bile duct carries bile from the gallbladder into the small intestine.  A bile leak from the cyst duct that is clipped when your gallbladder is removed.  Medicines  Ask your health care provider about: ? Changing or stopping your regular medicines. This is especially important if you are taking diabetes medicines or blood  thinners. ? Taking medicines such as aspirin and ibuprofen. These medicines can thin your blood. Do not take these medicines before your procedure if your health care provider instructs you not to.  You may be given antibiotic medicine to help prevent infection. General instructions  Let your health care provider know if you develop a cold or an infection before surgery.  Plan to have someone take you home from the hospital or clinic.  Ask your health care provider how your surgical site will be marked or identified. What happens during the procedure?  To reduce your risk of infection: ? Your health care team will wash or sanitize their hands. ? Your skin will be washed with soap. ? Hair may be removed from the surgical area.  An IV tube may be inserted into one of your veins.  You will be given one or more of the following: ? A medicine to help you relax (sedative). ? A medicine to make you fall asleep (general anesthetic).  A breathing tube will be placed in your mouth.  Your surgeon will make several small cuts (incisions) in your abdomen.  The laparoscope will be inserted through one of the small incisions. The camera on the laparoscope will send images to a TV screen (monitor) in the operating room. This lets your surgeon see inside your abdomen.  Air-like gas will be pumped into your abdomen. This will expand your abdomen to give the surgeon more room to perform the surgery.  Other tools that are needed for the procedure will be inserted through the other incisions. The gallbladder will be removed through one of the incisions.  Your common bile duct may be examined. If stones are found  in the common bile duct, they may be removed.  After your gallbladder has been removed, the incisions will be closed with stitches (sutures), staples, or skin glue.  Your incisions may be covered with a bandage (dressing). The procedure may vary among health care providers and  hospitals. What happens after the procedure?  Your blood pressure, heart rate, breathing rate, and blood oxygen level will be monitored until the medicines you were given have worn off.  You will be given medicines as needed to control your pain.  Do not drive for 24 hours if you were given a sedative. This information is not intended to replace advice given to you by your health care provider. Make sure you discuss any questions you have with your health care provider. Document Released: 10/16/2005 Document Revised: 05/07/2016 Document Reviewed: 04/03/2016 Elsevier Interactive Patient Education  2018 [employer].  Cholelithiasis Cholelithiasis is also called "gallstones." It is a kind of gallbladder disease. The gallbladder is an organ that stores a liquid (bile) that helps you digest fat. Gallstones may not cause symptoms (may be silent gallstones) until they cause a blockage, and then they can cause pain (gallbladder attack). Follow these instructions at home:  Take over-the-counter and prescription medicines only as told by your doctor.  Stay at a healthy weight.  Eat healthy foods. This includes: ? Eating fewer fatty foods, like fried foods. ? Eating fewer refined carbs (refined carbohydrates). Refined carbs are breads and grains that are highly processed, like white bread and white rice. Instead, choose whole grains like whole-wheat bread and brown rice. ? Eating more fiber. Almonds, fresh fruit, and beans are healthy sources of fiber.  Keep all follow-up visits as told by your doctor. This is important. Contact a doctor if:  You have sudden pain in the upper right side of your belly (abdomen). Pain might spread to your right shoulder or your chest. This may be a sign of a gallbladder attack.  You feel sick to your stomach (are nauseous).  You throw up (vomit).  You have been diagnosed with gallstones that have no symptoms and you get: ? Belly pain. ? Discomfort, burning,  or fullness in the upper part of your belly (indigestion). Get help right away if:  You have sudden pain in the upper right side of your belly, and it lasts for more than 2 hours.  You have belly pain that lasts for more than 5 hours.  You have a fever or chills.  You keep feeling sick to your stomach or you keep throwing up.  Your skin or the whites of your eyes turn yellow (jaundice).  You have dark-colored pee (urine).  You have light-colored poop (stool). Summary  Cholelithiasis is also called "gallstones."  The gallbladder is an organ that stores a liquid (bile) that helps you digest fat.  Silent gallstones are gallstones that do not cause symptoms.  A gallbladder attack may cause sudden pain in the upper right side of your belly. Pain might spread to your right shoulder or your chest. If this happens, contact your doctor.  If you have sudden pain in the upper right side of your belly that lasts for more than 2 hours, get help right away. This information is not intended to replace advice given to you by your health care provider. Make sure you discuss any questions you have with your health care provider. Document Released: 04/03/2008 Document Revised: 07/02/2016 Document Reviewed: 07/02/2016 Elsevier Interactive Patient Education  2017 [employer].

## 2018-06-18 NOTE — H&P (Signed)
[facility] History and Physical  Reason for Referral: Gallstones  Referring Physician: Ed      Chief Complaint    Cholelithiasis      Danielle Price is a 77 y.o. female.  HPI: Danielle Price is a 77 yo otherwise relatively healthy lady who went to the ED on 8/8 after having worsening RUQ pain that came on with some minestrone soup and a hot dog. She has known gallstones and felt that she was having a possible attack. She says she gets pressure under her rib cage esp on the right side and bloating/ beltching. She has had an EGD in the past with Dr. Oneida Alar that  Showed some gastritis. The patient has taken omeprazole on and off but does not feel that it changes her symptoms. She says that she has had symptoms of this nature for over 4 years and feels that things are getting worse.    She denies any fevers or chills and says that the pain can sometimes go into her chest but that she otherwise has no chest pain or shortness of breath or any cardiac symptoms.        Past Medical History:  Diagnosis Date  . Anemia    as a teenager  . Anxiety   . GERD (gastroesophageal reflux disease)   . Glaucoma    Dr. Martie Round [facility]         Past Surgical History:  Procedure Laterality Date  . CATARACT EXTRACTION    . Coccyx chipped off    . COLONOSCOPY N/A 02/23/2014   Procedure: COLONOSCOPY;  Surgeon: Danie Binder, MD;  Location: [facility];  Service: Endoscopy;  Laterality: N/A;  9:30 AM  . ESOPHAGOGASTRODUODENOSCOPY N/A 02/23/2014   Procedure: ESOPHAGOGASTRODUODENOSCOPY (EGD);  Surgeon: Danie Binder, MD;  Location: [facility];  Service: Endoscopy;  Laterality: N/A;  . TUBAL LIGATION           Family History  Problem Relation Age of Onset  . Heart disease Mother   . Cancer Father        prostate   . COPD Father   . Cancer Brother        prostate  . Hyperlipidemia Brother   . Hyperlipidemia Brother   .  Hypertension Brother   . Colon polyps Brother        had to have colectomy  . Colon cancer Neg Hx     Social History        Tobacco Use  . Smoking status: Current Every Day Smoker    Packs/day: 0.50    Years: 35.00    Pack years: 17.50    Types: Cigarettes  . Smokeless tobacco: Never Used  . Tobacco comment: 7 or 8 cigarettes a day  Substance Use Topics  . Alcohol use: No  . Drug use: No    Medications: I have reviewed the patient's current medications.      Allergies as of 06/18/2018      Reactions   Iron Rash               Medication List            Accurate as of 06/18/18 11:39 AM. Always use your most recent med list.           calcium carbonate 500 MG chewable tablet Commonly known as:  TUMS - dosed in mg elemental calcium Chew 0.5-1 tablets by mouth daily as needed for indigestion or  heartburn.   ibuprofen 200 MG tablet Commonly known as:  ADVIL,MOTRIN Take 200 mg by mouth daily as needed for mild pain or moderate pain.   omeprazole 20 MG capsule Commonly known as:  PRILOSEC 1 po 30 minutes before breakfast        ROS:  A comprehensive review of systems was negative except for: Eyes: positive for double vision Gastrointestinal: positive for abdominal pain and indigestion dry skin  Blood pressure (!) 173/94, pulse 93, temperature 98 F (36.7 C), temperature source Temporal, resp. rate 18, weight 125 lb (56.7 kg). Physical Exam  Constitutional: She is oriented to person, place, and time. She appears well-developed.  HENT:  Head: Normocephalic and atraumatic.  Eyes: Pupils are equal, round, and reactive to light. EOM are normal.  Neck: Normal range of motion.  Cardiovascular: Normal rate and regular rhythm.  Pulmonary/Chest: Effort normal and breath sounds normal.  Abdominal: Soft. She exhibits no distension. There is no tenderness.  Skin tag 0.75cm, at epigastric site   Musculoskeletal: Normal range of  motion.  Neurological: She is alert and oriented to person, place, and time.  Skin: Skin is warm and dry.  Psychiatric: She has a normal mood and affect. Her behavior is normal. Judgment and thought content normal.  Vitals reviewed.   Results: Personally reviewed- gallstones with no evidence of fluid but some mild thickening, stable hemangiomas on CT scan  [place] RUQ  IMPRESSION: 1. Multiple gallstones largest measuring up to 10.7 mm. Gallbladder wall thickening. Per sonographer, patient was not tender over the gallbladder during scanning. Clinical correlation recommended. 2. Hemangioma posterior right lobe liver appears similar to prior exams.  CT a/p  IMPRESSION: 1. Cholelithiasis without evidence of acute cholecystitis. 2. Aortic atherosclerosis (ICD10-I70.0).  Assessment & Plan:  Danielle Price is a 77 y.o. female with cholelithiasis that is worsening / possible chronic cholecystitis and a skin tag on her abdomen that she wants to get removed.   -OR for lap cholecystectomy, will also plan to remove the skin tag, she will remind [place] that day  -Patient aware and ok that Dr. Arnoldo Morale maybe doing the surgery if I am out on leave.   -PLAN: I counseled the patient about the indication, risks and benefits of laparoscopic cholecystectomy.  She understands there is a very small chance for bleeding, infection, injury to normal structures (including common bile duct), conversion to open surgery, persistent symptoms, evolution of postcholecystectomy diarrhea, need for secondary interventions, anesthesia reaction, cardiopulmonary issues and other risks not specifically detailed here. I described the expected recovery, the plan for follow-up and the restrictions during the recovery phase.  All questions were answered.   All questions were answered to the satisfaction of the patient and family.   Virl Cagey 06/18/2018, 11:39 AM

## 2018-06-18 NOTE — Progress Notes (Signed)
[facility] History and Physical  Reason for Referral: Gallstones  Referring Physician: Ed   Chief Complaint    Cholelithiasis      Danielle Price is a 77 y.o. female.  HPI: Danielle Price is a 77 yo otherwise relatively healthy lady who went to the ED on 8/8 after having worsening RUQ pain that came on with some minestrone soup and a hot dog. She has known gallstones and felt that she was having a possible attack. She says she gets pressure under her rib cage esp on the right side and bloating/ beltching. She has had an EGD in the past with Dr. Oneida Alar that  Showed some gastritis. The patient has taken omeprazole on and off but does not feel that it changes her symptoms. She says that she has had symptoms of this nature for over 4 years and feels that things are getting worse.    She denies any fevers or chills and says that the pain can sometimes go into her chest but that she otherwise has no chest pain or shortness of breath or any cardiac symptoms.    Past Medical History:  Diagnosis Date  . Anemia    as a teenager  . Anxiety   . GERD (gastroesophageal reflux disease)   . Glaucoma    Dr. Martie Round [facility]    Past Surgical History:  Procedure Laterality Date  . CATARACT EXTRACTION    . Coccyx chipped off    . COLONOSCOPY N/A 02/23/2014   Procedure: COLONOSCOPY;  Surgeon: Danie Binder, MD;  Location: [facility];  Service: Endoscopy;  Laterality: N/A;  9:30 AM  . ESOPHAGOGASTRODUODENOSCOPY N/A 02/23/2014   Procedure: ESOPHAGOGASTRODUODENOSCOPY (EGD);  Surgeon: Danie Binder, MD;  Location: [facility];  Service: Endoscopy;  Laterality: N/A;  . TUBAL LIGATION      Family History  Problem Relation Age of Onset  . Heart disease Mother   . Cancer Father        prostate   . COPD Father   . Cancer Brother        prostate  . Hyperlipidemia Brother   . Hyperlipidemia Brother   . Hypertension Brother   . Colon polyps Brother        had to  have colectomy  . Colon cancer Neg Hx     Social History   Tobacco Use  . Smoking status: Current Every Day Smoker    Packs/day: 0.50    Years: 35.00    Pack years: 17.50    Types: Cigarettes  . Smokeless tobacco: Never Used  . Tobacco comment: 7 or 8 cigarettes a day  Substance Use Topics  . Alcohol use: No  . Drug use: No    Medications: I have reviewed the patient's current medications. Allergies as of 06/18/2018      Reactions   Iron Rash      Medication List        Accurate as of 06/18/18 11:39 AM. Always use your most recent med list.          calcium carbonate 500 MG chewable tablet Commonly known as:  TUMS - dosed in mg elemental calcium Chew 0.5-1 tablets by mouth daily as needed for indigestion or heartburn.   ibuprofen 200 MG tablet Commonly known as:  ADVIL,MOTRIN Take 200 mg by mouth daily as needed for mild pain or moderate pain.   omeprazole 20 MG capsule Commonly known as:  PRILOSEC 1 po 30 minutes  before breakfast        ROS:  A comprehensive review of systems was negative except for: Eyes: positive for double vision Gastrointestinal: positive for abdominal pain and indigestion dry skin  Blood pressure (!) 173/94, pulse 93, temperature 98 F (36.7 C), temperature source Temporal, resp. rate 18, weight 125 lb (56.7 kg). Physical Exam  Constitutional: She is oriented to person, place, and time. She appears well-developed.  HENT:  Head: Normocephalic and atraumatic.  Eyes: Pupils are equal, round, and reactive to light. EOM are normal.  Neck: Normal range of motion.  Cardiovascular: Normal rate and regular rhythm.  Pulmonary/Chest: Effort normal and breath sounds normal.  Abdominal: Soft. She exhibits no distension. There is no tenderness.  Skin tag 0.75cm, at epigastric site   Musculoskeletal: Normal range of motion.  Neurological: She is alert and oriented to person, place, and time.  Skin: Skin is warm and dry.  Psychiatric: She has  a normal mood and affect. Her behavior is normal. Judgment and thought content normal.  Vitals reviewed.   Results: Personally reviewed- gallstones with no evidence of fluid but some mild thickening, stable hemangiomas on CT scan  [place] RUQ  IMPRESSION: 1. Multiple gallstones largest measuring up to 10.7 mm. Gallbladder wall thickening. Per sonographer, patient was not tender over the gallbladder during scanning. Clinical correlation recommended. 2. Hemangioma posterior right lobe liver appears similar to prior exams.  CT a/p  IMPRESSION: 1. Cholelithiasis without evidence of acute cholecystitis. 2.  Aortic atherosclerosis (ICD10-I70.0).  Assessment & Plan:  Danielle Price is a 77 y.o. female with cholelithiasis that is worsening / possible chronic cholecystitis and a skin tag on her abdomen that she wants to get removed.   -OR for lap cholecystectomy, will also plan to remove the skin tag, she will remind [place] that day  -Patient aware and ok that Dr. Arnoldo Morale maybe doing the surgery if I am out on leave.   -PLAN: I counseled the patient about the indication, risks and benefits of laparoscopic cholecystectomy.  She understands there is a very small chance for bleeding, infection, injury to normal structures (including common bile duct), conversion to open surgery, persistent symptoms, evolution of postcholecystectomy diarrhea, need for secondary interventions, anesthesia reaction, cardiopulmonary issues and other risks not specifically detailed here. I described the expected recovery, the plan for follow-up and the restrictions during the recovery phase.  All questions were answered.   All questions were answered to the satisfaction of the patient and family.   Virl Cagey 06/18/2018, 11:39 AM

## 2018-06-24 ENCOUNTER — Encounter (HOSPITAL_COMMUNITY)
Admission: RE | Admit: 2018-06-24 | Discharge: 2018-06-24 | Disposition: A | Discharge status: Home / Self Care | Payer: Medicare Other | Source: Ambulatory Visit | Attending: General Surgery | Admitting: General Surgery

## 2018-06-24 ENCOUNTER — Other Ambulatory Visit: Payer: Self-pay

## 2018-06-24 ENCOUNTER — Encounter (HOSPITAL_COMMUNITY): Payer: Self-pay

## 2018-06-24 ENCOUNTER — Encounter (HOSPITAL_COMMUNITY): Payer: Self-pay | Admitting: Emergency Medicine

## 2018-06-24 ENCOUNTER — Emergency Department (HOSPITAL_COMMUNITY)
Admission: EM | Admit: 2018-06-24 | Discharge: 2018-06-25 | Disposition: A | Discharge status: Home / Self Care | Payer: Medicare Other | Attending: Emergency Medicine | Admitting: Emergency Medicine

## 2018-06-24 DIAGNOSIS — K8050 Calculus of bile duct without cholangitis or cholecystitis without obstruction: Secondary | ICD-10-CM | POA: Diagnosis not present

## 2018-06-24 DIAGNOSIS — F17210 Nicotine dependence, cigarettes, uncomplicated: Secondary | ICD-10-CM | POA: Diagnosis not present

## 2018-06-24 DIAGNOSIS — R110 Nausea: Secondary | ICD-10-CM | POA: Diagnosis not present

## 2018-06-24 DIAGNOSIS — R1010 Upper abdominal pain, unspecified: Secondary | ICD-10-CM | POA: Diagnosis not present

## 2018-06-24 NOTE — Patient Instructions (Signed)
Danielle Price  06/24/2018     @PREFPERIOPPHARMACY @   Your procedure is scheduled on  06/26/2018 .  Report to [facility] at  820   A.M.  Call this number if you have problems the morning of surgery:  [phone]   Remember:  Do not eat or drink after midnight.  You may drink clear liquids until  12 midnight 06/25/2018 .  Clear liquids allowed are:                    Water, Juice (non-citric and without pulp), Carbonated beverages, Clear Tea, Black Coffee only, Plain Jell-O only, Gatorade and Plain Popsicles only    Take these medicines the morning of surgery with A SIP OF WATER  prilosec.    Do not wear jewelry, make-up or nail polish.  Do not wear lotions, powders, or perfumes, or deodorant.  Do not shave 48 hours prior to surgery.  Men may shave face and neck.  Do not bring valuables to the hospital.  [facility] is not responsible for any belongings or valuables.  Contacts, dentures or bridgework may not be worn into surgery.  Leave your suitcase in the car.  After surgery it may be brought to your room.  For patients admitted to the hospital, discharge time will be determined by your treatment team.  Patients discharged the day of surgery will not be allowed to drive home.   Name and phone number of your driver:   family Special instructions:  None  Please read over the following fact sheets that you were given. Anesthesia Post-op Instructions and Care and Recovery After Surgery       Laparoscopic Cholecystectomy Laparoscopic cholecystectomy is surgery to remove the gallbladder. The gallbladder is a pear-shaped organ that lies beneath the liver on the right side of the body. The gallbladder stores bile, which is a fluid that helps the body to digest fats. Cholecystectomy is often done for inflammation of the gallbladder (cholecystitis). This condition is usually caused by a buildup of gallstones (cholelithiasis) in the gallbladder. Gallstones can block  the flow of bile, which can result in inflammation and pain. In severe cases, emergency surgery may be required. This procedure is done though small incisions in your abdomen (laparoscopic surgery). A thin scope with a camera (laparoscope) is inserted through one incision. Thin surgical instruments are inserted through the other incisions. In some cases, a laparoscopic procedure may be turned into a type of surgery that is done through a larger incision (open surgery). Tell a health care provider about:  Any allergies you have.  All medicines you are taking, including vitamins, herbs, eye drops, creams, and over-the-counter medicines.  Any problems you or family members have had with anesthetic medicines.  Any blood disorders you have.  Any surgeries you have had.  Any medical conditions you have.  Whether you are pregnant or may be pregnant. What are the risks? Generally, this is a safe procedure. However, problems may occur, including:  Infection.  Bleeding.  Allergic reactions to medicines.  Damage to other structures or organs.  A stone remaining in the common bile duct. The common bile duct carries bile from the gallbladder into the small intestine.  A bile leak from the cyst duct that is clipped when your gallbladder is removed.  What happens before the procedure? Staying hydrated Follow instructions from your health care provider about hydration, which may include:  Up to  2 hours before the procedure - you may continue to drink clear liquids, such as water, clear fruit juice, black coffee, and plain tea.  Eating and drinking restrictions Follow instructions from your health care provider about eating and drinking, which may include:  8 hours before the procedure - stop eating heavy meals or foods such as meat, fried foods, or fatty foods.  6 hours before the procedure - stop eating light meals or foods, such as toast or cereal.  6 hours before the procedure - stop  drinking milk or drinks that contain milk.  2 hours before the procedure - stop drinking clear liquids.  Medicines  Ask your health care provider about: ? Changing or stopping your regular medicines. This is especially important if you are taking diabetes medicines or blood thinners. ? Taking medicines such as aspirin and ibuprofen. These medicines can thin your blood. Do not take these medicines before your procedure if your health care provider instructs you not to.  You may be given antibiotic medicine to help prevent infection. General instructions  Let your health care provider know if you develop a cold or an infection before surgery.  Plan to have someone take you home from the hospital or clinic.  Ask your health care provider how your surgical site will be marked or identified. What happens during the procedure?  To reduce your risk of infection: ? Your health care team will wash or sanitize their hands. ? Your skin will be washed with soap. ? Hair may be removed from the surgical area.  An IV tube may be inserted into one of your veins.  You will be given one or more of the following: ? A medicine to help you relax (sedative). ? A medicine to make you fall asleep (general anesthetic).  A breathing tube will be placed in your mouth.  Your surgeon will make several small cuts (incisions) in your abdomen.  The laparoscope will be inserted through one of the small incisions. The camera on the laparoscope will send images to a TV screen (monitor) in the operating room. This lets your surgeon see inside your abdomen.  Air-like gas will be pumped into your abdomen. This will expand your abdomen to give the surgeon more room to perform the surgery.  Other tools that are needed for the procedure will be inserted through the other incisions. The gallbladder will be removed through one of the incisions.  Your common bile duct may be examined. If stones are found in the common  bile duct, they may be removed.  After your gallbladder has been removed, the incisions will be closed with stitches (sutures), staples, or skin glue.  Your incisions may be covered with a bandage (dressing). The procedure may vary among health care providers and hospitals. What happens after the procedure?  Your blood pressure, heart rate, breathing rate, and blood oxygen level will be monitored until the medicines you were given have worn off.  You will be given medicines as needed to control your pain.  Do not drive for 24 hours if you were given a sedative. This information is not intended to replace advice given to you by your health care provider. Make sure you discuss any questions you have with your health care provider. Document Released: 10/16/2005 Document Revised: 05/07/2016 Document Reviewed: 04/03/2016 Elsevier Interactive Patient Education  2018 [employer].  Laparoscopic Cholecystectomy, Care After This sheet gives you information about how to care for yourself after your procedure. Your health care  provider may also give you more specific instructions. If you have problems or questions, contact your health care provider. What can I expect after the procedure? After the procedure, it is common to have:  Pain at your incision sites. You will be given medicines to control this pain.  Mild nausea or vomiting.  Bloating and possible shoulder pain from the air-like gas that was used during the procedure.  Follow these instructions at home: Incision care   Follow instructions from your health care provider about how to take care of your incisions. Make sure you: ? Wash your hands with soap and water before you change your bandage (dressing). If soap and water are not available, use hand sanitizer. ? Change your dressing as told by your health care provider. ? Leave stitches (sutures), skin glue, or adhesive strips in place. These skin closures may need to be in place  for 2 weeks or longer. If adhesive strip edges start to loosen and curl up, you may trim the loose edges. Do not remove adhesive strips completely unless your health care provider tells you to do that.  Do not take baths, swim, or use a hot tub until your health care provider approves. Ask your health care provider if you can take showers. You may only be allowed to take sponge baths for bathing.  Check your incision area every day for signs of infection. Check for: ? More redness, swelling, or pain. ? More fluid or blood. ? Warmth. ? Pus or a bad smell. Activity  Do not drive or use heavy machinery while taking prescription pain medicine.  Do not lift anything that is heavier than 10 lb (4.5 kg) until your health care provider approves.  Do not play contact sports until your health care provider approves.  Do not drive for 24 hours if you were given a medicine to help you relax (sedative).  Rest as needed. Do not return to work or school until your health care provider approves. General instructions  Take over-the-counter and prescription medicines only as told by your health care provider.  To prevent or treat constipation while you are taking prescription pain medicine, your health care provider may recommend that you: ? Drink enough fluid to keep your urine clear or pale yellow. ? Take over-the-counter or prescription medicines. ? Eat foods that are high in fiber, such as fresh fruits and vegetables, whole grains, and beans. ? Limit foods that are high in fat and processed sugars, such as fried and sweet foods. Contact a health care provider if:  You develop a rash.  You have more redness, swelling, or pain around your incisions.  You have more fluid or blood coming from your incisions.  Your incisions feel warm to the touch.  You have pus or a bad smell coming from your incisions.  You have a fever.  One or more of your incisions breaks open. Get help right away  if:  You have trouble breathing.  You have chest pain.  You have increasing pain in your shoulders.  You faint or feel dizzy when you stand.  You have severe pain in your abdomen.  You have nausea or vomiting that lasts for more than one day.  You have leg pain. This information is not intended to replace advice given to you by your health care provider. Make sure you discuss any questions you have with your health care provider. Document Released: 10/16/2005 Document Revised: 05/06/2016 Document Reviewed: 04/03/2016 Elsevier Interactive Patient Education  2018 [place] Anesthesia, Adult General anesthesia is the use of medicines to make a person "go to sleep" (be unconscious) for a medical procedure. General anesthesia is often recommended when a procedure:  Is long.  Requires you to be still or in an unusual position.  Is major and can cause you to lose blood.  Is impossible to do without general anesthesia.  The medicines used for general anesthesia are called general anesthetics. In addition to making you sleep, the medicines:  Prevent pain.  Control your blood pressure.  Relax your muscles.  Tell a health care provider about:  Any allergies you have.  All medicines you are taking, including vitamins, herbs, eye drops, creams, and over-the-counter medicines.  Any problems you or family members have had with anesthetic medicines.  Types of anesthetics you have had in the past.  Any bleeding disorders you have.  Any surgeries you have had.  Any medical conditions you have.  Any history of heart or lung conditions, such as heart failure, sleep apnea, or chronic obstructive pulmonary disease (COPD).  Whether you are pregnant or may be pregnant.  Whether you use tobacco, alcohol, marijuana, or street drugs.  Any history of Armed forces logistics/support/administrative officer.  Any history of depression or anxiety. What are the risks? Generally, this is a safe procedure.  However, problems may occur, including:  Allergic reaction to anesthetics.  Lung and heart problems.  Inhaling food or liquids from your stomach into your lungs (aspiration).  Injury to nerves.  Waking up during your procedure and being unable to move (rare).  Extreme agitation or a state of mental confusion (delirium) when you wake up from the anesthetic.  Air in the bloodstream, which can lead to stroke.  These problems are more likely to develop if you are having a major surgery or if you have an advanced medical condition. You can prevent some of these complications by answering all of your health care provider's questions thoroughly and by following all pre-procedure instructions. General anesthesia can cause side effects, including:  Nausea or vomiting  A sore throat from the breathing tube.  Feeling cold or shivery.  Feeling tired, washed out, or achy.  Sleepiness or drowsiness.  Confusion or agitation.  What happens before the procedure? Staying hydrated Follow instructions from your health care provider about hydration, which may include:  Up to 2 hours before the procedure - you may continue to drink clear liquids, such as water, clear fruit juice, black coffee, and plain tea.  Eating and drinking restrictions Follow instructions from your health care provider about eating and drinking, which may include:  8 hours before the procedure - stop eating heavy meals or foods such as meat, fried foods, or fatty foods.  6 hours before the procedure - stop eating light meals or foods, such as toast or cereal.  6 hours before the procedure - stop drinking milk or drinks that contain milk.  2 hours before the procedure - stop drinking clear liquids.  Medicines  Ask your health care provider about: ? Changing or stopping your regular medicines. This is especially important if you are taking diabetes medicines or blood thinners. ? Taking medicines such as aspirin and  ibuprofen. These medicines can thin your blood. Do not take these medicines before your procedure if your health care provider instructs you not to. ? Taking new dietary supplements or medicines. Do not take these during the week before your procedure unless your health care provider approves them.  If you are told to take a medicine or to continue taking a medicine on the day of the procedure, take the medicine with sips of water. General instructions   Ask if you will be going home the same day, the following day, or after a longer hospital stay. ? Plan to have someone take you home. ? Plan to have someone stay with you for the first 24 hours after you leave the hospital or clinic.  For 3-6 weeks before the procedure, try not to use any tobacco products, such as cigarettes, chewing tobacco, and e-cigarettes.  You may brush your teeth on the morning of the procedure, but make sure to spit out the toothpaste. What happens during the procedure?  You will be given anesthetics through a mask and through an IV tube in one of your veins.  You may receive medicine to help you relax (sedative).  As soon as you are asleep, a breathing tube may be used to help you breathe.  An anesthesia specialist will stay with you throughout the procedure. He or she will help keep you comfortable and safe by continuing to give you medicines and adjusting the amount of medicine that you get. He or she will also watch your blood pressure, pulse, and oxygen levels to make sure that the anesthetics do not cause any problems.  If a breathing tube was used to help you breathe, it will be removed before you wake up. The procedure may vary among health care providers and hospitals. What happens after the procedure?  You will wake up, often slowly, after the procedure is complete, usually in a recovery area.  Your blood pressure, heart rate, breathing rate, and blood oxygen level will be monitored until the medicines  you were given have worn off.  You may be given medicine to help you calm down if you feel anxious or agitated.  If you will be going home the same day, your health care provider may check to make sure you can stand, drink, and urinate.  Your health care providers will treat your pain and side effects before you go home.  Do not drive for 24 hours if you received a sedative.  You may: ? Feel nauseous and vomit. ? Have a sore throat. ? Have mental slowness. ? Feel cold or shivery. ? Feel sleepy. ? Feel tired. ? Feel sore or achy, even in parts of your body where you did not have surgery. This information is not intended to replace advice given to you by your health care provider. Make sure you discuss any questions you have with your health care provider. Document Released: 01/23/2008 Document Revised: 03/28/2016 Document Reviewed: 09/30/2015 Elsevier Interactive Patient Education  2018 [place] Anesthesia, Adult, Care After These instructions provide you with information about caring for yourself after your procedure. Your health care provider may also give you more specific instructions. Your treatment has been planned according to current medical practices, but problems sometimes occur. Call your health care provider if you have any problems or questions after your procedure. What can I expect after the procedure? After the procedure, it is common to have:  Vomiting.  A sore throat.  Mental slowness.  It is common to feel:  Nauseous.  Cold or shivery.  Sleepy.  Tired.  Sore or achy, even in parts of your body where you did not have surgery.  Follow these instructions at home: For at least 24 hours after the procedure:  Do not: ? Participate  in activities where you could fall or become injured. ? Drive. ? Use heavy machinery. ? Drink alcohol. ? Take sleeping pills or medicines that cause drowsiness. ? Make important decisions or sign legal  documents. ? Take care of children on your own.  Rest. Eating and drinking  If you vomit, drink water, juice, or soup when you can drink without vomiting.  Drink enough fluid to keep your urine clear or pale yellow.  Make sure you have little or no nausea before eating solid foods.  Follow the diet recommended by your health care provider. General instructions  Have a responsible adult stay with you until you are awake and alert.  Return to your normal activities as told by your health care provider. Ask your health care provider what activities are safe for you.  Take over-the-counter and prescription medicines only as told by your health care provider.  If you smoke, do not smoke without supervision.  Keep all follow-up visits as told by your health care provider. This is important. Contact a health care provider if:  You continue to have nausea or vomiting at home, and medicines are not helpful.  You cannot drink fluids or start eating again.  You cannot urinate after 8-12 hours.  You develop a skin rash.  You have fever.  You have increasing redness at the site of your procedure. Get help right away if:  You have difficulty breathing.  You have chest pain.  You have unexpected bleeding.  You feel that you are having a life-threatening or urgent problem. This information is not intended to replace advice given to you by your health care provider. Make sure you discuss any questions you have with your health care provider. Document Released: 01/22/2001 Document Revised: 03/20/2016 Document Reviewed: 09/30/2015 Elsevier Interactive Patient Education  [employer].

## 2018-06-24 NOTE — ED Triage Notes (Signed)
Pt with c/o abdominal pain. Stated pain started after eating a cheeseburger. Pt is scheduled to have gallbladder removed by Dr Arnoldo Morale on Wednesday.

## 2018-06-25 LAB — CBC
HEMATOCRIT: 42.3 % (ref 36.0–46.0)
HEMOGLOBIN: 14 g/dL (ref 12.0–15.0)
MCH: 30.7 pg (ref 26.0–34.0)
MCHC: 33.1 g/dL (ref 30.0–36.0)
MCV: 92.8 fL (ref 78.0–100.0)
Platelets: 333 10*3/uL (ref 150–400)
RBC: 4.56 MIL/uL (ref 3.87–5.11)
RDW: 12.7 % (ref 11.5–15.5)
WBC: 7.6 10*3/uL (ref 4.0–10.5)

## 2018-06-25 LAB — COMPREHENSIVE METABOLIC PANEL
ALBUMIN: 4.7 g/dL (ref 3.5–5.0)
ALT: 14 U/L (ref 0–44)
ANION GAP: 7 (ref 5–15)
AST: 18 U/L (ref 15–41)
Alkaline Phosphatase: 66 U/L (ref 38–126)
BILIRUBIN TOTAL: 0.8 mg/dL (ref 0.3–1.2)
BUN: 16 mg/dL (ref 8–23)
CO2: 27 mmol/L (ref 22–32)
Calcium: 9.6 mg/dL (ref 8.9–10.3)
Chloride: 104 mmol/L (ref 98–111)
Creatinine, Ser: 0.82 mg/dL (ref 0.44–1.00)
GFR calc Af Amer: >60 mL/min (ref >60)
GFR calc non Af Amer: >60 mL/min (ref >60)
GLUCOSE: 118 mg/dL — AB (ref 70–99)
POTASSIUM: 3.6 mmol/L (ref 3.5–5.1)
Sodium: 138 mmol/L (ref 135–145)
TOTAL PROTEIN: 7.8 g/dL (ref 6.5–8.1)

## 2018-06-25 LAB — LIPASE, BLOOD: LIPASE: 29 U/L (ref 11–51)

## 2018-06-25 MED ORDER — HYDROCODONE-ACETAMINOPHEN 5-325 MG PO TABS: 1.0000 | ORAL_TABLET | ORAL | 0 refills | Status: DC | PRN

## 2018-06-25 MED ORDER — ONDANSETRON 4 MG PO TBDP: 4.0000 mg | ORAL_TABLET | Freq: Three times a day (TID) | ORAL | 0 refills | Status: AC | PRN

## 2018-06-25 MED ORDER — ONDANSETRON HCL 4 MG/2ML IJ SOLN
4.0000 mg | Freq: Once | INTRAMUSCULAR | Status: AC
  Administered 2018-06-25: 4 mg via INTRAVENOUS
  Filled 2018-06-25: qty 2

## 2018-06-25 MED ORDER — SODIUM CHLORIDE 0.9 % IV BOLUS
1000.0000 mL | Freq: Once | INTRAVENOUS | Status: AC
  Administered 2018-06-25: 1000 mL via INTRAVENOUS

## 2018-06-25 MED ORDER — FENTANYL CITRATE (PF) 100 MCG/2ML IJ SOLN
50.0000 ug | Freq: Once | INTRAMUSCULAR | Status: AC
  Administered 2018-06-25: 50 ug via INTRAVENOUS
  Filled 2018-06-25: qty 2

## 2018-06-25 MED ORDER — OXYCODONE-ACETAMINOPHEN 5-325 MG PO TABS
1.0000 | ORAL_TABLET | Freq: Once | ORAL | Status: AC
  Administered 2018-06-25: 1 via ORAL
  Filled 2018-06-25: qty 1

## 2018-06-25 NOTE — Discharge Instructions (Addendum)
Take the pain medication as prescribed.  Avoid fatty and greasy foods.  Follow-up with Dr. Arnoldo Morale for your gallbladder surgery as scheduled.  Return to the ED with worsening pain, fever, vomiting or other concerns.

## 2018-06-25 NOTE — ED Provider Notes (Addendum)
[facility] EMERGENCY DEPARTMENT Provider Note   CSN: [id number] Arrival date & time: 06/24/18  2327     History   Chief Complaint Chief Complaint  Patient presents with  . Abdominal Pain    HPI Danielle Price is a 77 y.o. female.  Patient presents with upper abdominal pain that started around 3 PM after eating a cheeseburger.  She is had nausea but no vomiting.  Pain feels similar to previous "gallbladder attacks".  She is scheduled to have her gallbladder removed by Dr. Arnoldo Morale in 2 days.  She reports this pain is similar.  She took ibuprofen at home without relief.  Pain is constant and worse with palpation.  She came in tonight after she could not sleep because of the pain.  She denies any fever, chills, pain with urination or blood in the urine.  No diarrhea.  The pain is in her upper abdomen and right upper quadrant underneath her ribs.  Denies any chest pain or shortness of breath. Reports this pain is similar to when she was seen in the ED on August 8.  The history is provided by the patient.  Abdominal Pain   Associated symptoms include nausea. Pertinent negatives include vomiting, dysuria, headaches and arthralgias.    Past Medical History:  Diagnosis Date  . Anemia    as a teenager  . Anxiety   . GERD (gastroesophageal reflux disease)   . Glaucoma    Dr. Martie [facility]    Patient Active Problem List   Diagnosis Date Noted  . Dyspepsia 12/11/2013  . Encounter for screening colonoscopy 12/11/2013  . Elevated blood sugar 06/20/2012  . Elevated BP 05/23/2012  . Diplopia 05/23/2012  . Tobacco user 05/23/2012  . Anemia 05/23/2012    Past Surgical History:  Procedure Laterality Date  . CATARACT EXTRACTION    . Coccyx chipped off    . COLONOSCOPY N/A 02/23/2014   Procedure: COLONOSCOPY;  Surgeon: Danie Binder, MD;  Location: [facility] ENDO SUITE;  Service: Endoscopy;  Laterality: N/A;  9:30 AM  . ESOPHAGOGASTRODUODENOSCOPY N/A 02/23/2014   Procedure: ESOPHAGOGASTRODUODENOSCOPY (EGD);  Surgeon: Danie Binder, MD;  Location: [facility] ENDO SUITE;  Service: Endoscopy;  Laterality: N/A;  . TUBAL LIGATION       OB History   None      Home Medications    Prior to Admission medications   Medication Sig Start Date End Date Taking? Authorizing Provider  calcium carbonate (TUMS - DOSED IN MG ELEMENTAL CALCIUM) 500 MG chewable tablet Chew 0.5-1 tablets by mouth daily as needed for indigestion or heartburn.   Yes [provider]  ibuprofen (ADVIL,MOTRIN) 200 MG tablet Take 200 mg by mouth daily as needed for mild pain or moderate pain.   Yes [provider]  omeprazole (PRILOSEC) 20 MG capsule 1 po 30 minutes before breakfast Patient taking differently: Take 20 mg by mouth daily as needed (for acid reflux). 1 po 30 minutes before breakfast 01/05/16  Yes Mesner, Corene Cornea, MD    Family History Family History  Problem Relation Age of Onset  . Heart disease Mother   . Cancer Father        prostate   . COPD Father   . Cancer Brother        prostate  . Hyperlipidemia Brother   . Hyperlipidemia Brother   . Hypertension Brother   . Colon polyps Brother        had to have colectomy  . Colon cancer  Neg Hx     Social History Social History   Tobacco Use  . Smoking status: Current Every Day Smoker    Packs/day: 0.50    Years: 35.00    Pack years: 17.50    Types: Cigarettes  . Smokeless tobacco: Never Used  . Tobacco comment: 7 or 8 cigarettes a day  Substance Use Topics  . Alcohol use: No  . Drug use: No     Allergies   Iron   Review of Systems Review of Systems  Constitutional: Positive for activity change and appetite change.  HENT: Negative for congestion.   Eyes: Negative for visual disturbance.  Respiratory: Negative for chest tightness and shortness of breath.   Cardiovascular: Negative for chest pain.  Gastrointestinal: Positive for abdominal pain and nausea. Negative for vomiting.    Genitourinary: Negative for dysuria, urgency, vaginal bleeding and vaginal discharge.  Musculoskeletal: Negative for arthralgias, back pain and neck pain.  Neurological: Negative for dizziness, weakness and headaches.    all other systems are negative except as noted in the HPI and PMH.   Physical Exam Updated Vital Signs BP (!) 175/80 (BP Location: Right Arm)   Pulse 98   Temp 98 F (36.7 C) (Oral)   Resp 18   Ht 5\' 4"  (1.626 m)   Wt 56.7 kg   SpO2 98%   BMI 21.46 kg/m   Physical Exam  Constitutional: She is oriented to person, place, and time. She appears well-developed and well-nourished. No distress.  Uncomfortable. Holding abdomen  HENT:  Head: Normocephalic and atraumatic.  Mouth/Throat: Oropharynx is clear and moist. No oropharyngeal exudate.  Eyes: Pupils are equal, round, and reactive to light. Conjunctivae and EOM are normal.  Neck: Normal range of motion. Neck supple.  No meningismus.  Cardiovascular: Normal rate, regular rhythm, normal heart sounds and intact distal pulses.  No murmur heard. Pulmonary/Chest: Effort normal and breath sounds normal. No respiratory distress.  Abdominal: Soft. There is tenderness. There is guarding. There is no rebound.  Epigastric and right upper quadrant tenderness with voluntary guarding  Musculoskeletal: Normal range of motion. She exhibits no edema or tenderness.  No CVAT  Neurological: She is alert and oriented to person, place, and time. No cranial nerve deficit. She exhibits normal muscle tone. Coordination normal.   5/5 strength throughout. CN 2-12 intact.Equal grip strength.   Skin: Skin is warm.  Psychiatric: She has a normal mood and affect. Her behavior is normal.  Nursing note and vitals reviewed.    ED Treatments / Results  Labs (all labs ordered are listed, but only abnormal results are displayed) Labs Reviewed  COMPREHENSIVE METABOLIC PANEL - Abnormal; Notable for the following components:      Result Value    Glucose, Bld 118 (*)    All other components within normal limits  LIPASE, BLOOD  CBC  URINALYSIS, ROUTINE W REFLEX MICROSCOPIC    EKG None  Radiology No results found.  Procedures Procedures (including critical care time)  Medications Ordered in ED Medications  fentaNYL (SUBLIMAZE) injection 50 mcg (has no administration in time range)  ondansetron (ZOFRAN) injection 4 mg (has no administration in time range)  sodium chloride 0.9 % bolus 1,000 mL (has no administration in time range)     Initial Impression / Assessment and Plan / ED Course  I have reviewed the triage vital signs and the nursing notes.  Pertinent labs & imaging results that were available during my care of the patient were reviewed by me and  considered in my medical decision making (see chart for details).    Upper abdominal pain similar to previous "gallbladder attacks." abdomen soft without peritoneal signs. Scheduled for cholecystectomy on August 28.  Imaging reviewed.  Patient did have CT scan and ultrasound on August 8 that showed multiple large gallstones  Lipase is normal, no leukocytosis, no fever.  LFTs have normalized.  Patient's pain is controlled in the ED.  Suspect biliary colic similar to previous episodes.  No indication for reimaging today.  There is no evidence of cholangitis. No fever. LFTs now normal. Lipase normal. WBC normal.   Her pain is controlled.  She will be sent home with pain medication and follow-up with Dr. Arnoldo Morale as scheduled for her cholecystectomy.  She is able to tolerate fluids without recurrence of symptoms. advised to avoid greasy foods. Return precautions discussed. Final Clinical Impressions(s) / ED Diagnoses   Final diagnoses:  Biliary colic    ED Discharge Orders    None       Gayle Collard, Annie Main, MD 06/25/18 0530    Ezequiel Essex, MD 06/25/18 [phone]

## 2018-06-25 NOTE — ED Notes (Signed)
Pt given ginger ale to drink and informed of wait time

## 2018-06-26 ENCOUNTER — Ambulatory Visit (HOSPITAL_COMMUNITY)
Admission: RE | Admit: 2018-06-26 | Discharge: 2018-06-26 | Disposition: A | Discharge status: Home / Self Care | Payer: Medicare Other | Source: Ambulatory Visit | Attending: General Surgery | Admitting: General Surgery

## 2018-06-26 ENCOUNTER — Encounter (HOSPITAL_COMMUNITY)
Admission: RE | Disposition: A | Discharge status: Home / Self Care | Payer: Self-pay | Source: Ambulatory Visit | Attending: General Surgery

## 2018-06-26 ENCOUNTER — Ambulatory Visit (HOSPITAL_COMMUNITY): Payer: Medicare Other | Admitting: Anesthesiology

## 2018-06-26 ENCOUNTER — Encounter (HOSPITAL_COMMUNITY): Payer: Self-pay | Admitting: *Deleted

## 2018-06-26 DIAGNOSIS — Z8371 Family history of colonic polyps: Secondary | ICD-10-CM | POA: Insufficient documentation

## 2018-06-26 DIAGNOSIS — K8020 Calculus of gallbladder without cholecystitis without obstruction: Secondary | ICD-10-CM

## 2018-06-26 DIAGNOSIS — K219 Gastro-esophageal reflux disease without esophagitis: Secondary | ICD-10-CM | POA: Diagnosis not present

## 2018-06-26 DIAGNOSIS — Z8249 Family history of ischemic heart disease and other diseases of the circulatory system: Secondary | ICD-10-CM | POA: Insufficient documentation

## 2018-06-26 DIAGNOSIS — D1803 Hemangioma of intra-abdominal structures: Secondary | ICD-10-CM | POA: Insufficient documentation

## 2018-06-26 DIAGNOSIS — Z79899 Other long term (current) drug therapy: Secondary | ICD-10-CM | POA: Diagnosis not present

## 2018-06-26 DIAGNOSIS — F17210 Nicotine dependence, cigarettes, uncomplicated: Secondary | ICD-10-CM | POA: Insufficient documentation

## 2018-06-26 DIAGNOSIS — H409 Unspecified glaucoma: Secondary | ICD-10-CM | POA: Insufficient documentation

## 2018-06-26 DIAGNOSIS — K8070 Calculus of gallbladder and bile duct without cholecystitis without obstruction: Secondary | ICD-10-CM | POA: Diagnosis present

## 2018-06-26 DIAGNOSIS — K8010 Calculus of gallbladder with chronic cholecystitis without obstruction: Secondary | ICD-10-CM | POA: Diagnosis not present

## 2018-06-26 DIAGNOSIS — I700 Atherosclerosis of aorta: Secondary | ICD-10-CM | POA: Insufficient documentation

## 2018-06-26 DIAGNOSIS — L918 Other hypertrophic disorders of the skin: Secondary | ICD-10-CM | POA: Insufficient documentation

## 2018-06-26 HISTORY — PX: CHOLECYSTECTOMY: SHX55

## 2018-06-26 SURGERY — LAPAROSCOPIC CHOLECYSTECTOMY
Anesthesia: General | Site: Abdomen

## 2018-06-26 MED ORDER — LIDOCAINE HCL (CARDIAC) PF 50 MG/5ML IV SOSY
PREFILLED_SYRINGE | INTRAVENOUS | Status: DC | PRN
  Administered 2018-06-26: 30 mg via INTRAVENOUS

## 2018-06-26 MED ORDER — CHLORHEXIDINE GLUCONATE CLOTH 2 % EX PADS: 6.0000 | MEDICATED_PAD | Freq: Once | CUTANEOUS | Status: DC

## 2018-06-26 MED ORDER — ROCURONIUM 10MG/ML (10ML) SYRINGE FOR MEDFUSION PUMP - OPTIME
INTRAVENOUS | Status: DC | PRN
  Administered 2018-06-26: 35 mg via INTRAVENOUS

## 2018-06-26 MED ORDER — LACTATED RINGERS IV SOLN
INTRAVENOUS | Status: DC
  Administered 2018-06-26: 10:00:00 via INTRAVENOUS

## 2018-06-26 MED ORDER — PROPOFOL 10 MG/ML IV BOLUS
INTRAVENOUS | Status: DC | PRN
  Administered 2018-06-26: 125 mg via INTRAVENOUS

## 2018-06-26 MED ORDER — CEFOTETAN DISODIUM 2 G IJ SOLR
2.0000 g | INTRAMUSCULAR | Status: AC
  Administered 2018-06-26: 2 g via INTRAVENOUS
  Filled 2018-06-26: qty 2

## 2018-06-26 MED ORDER — KETOROLAC TROMETHAMINE 30 MG/ML IJ SOLN
15.0000 mg | Freq: Once | INTRAMUSCULAR | Status: AC
  Administered 2018-06-26: 15 mg via INTRAVENOUS
  Filled 2018-06-26: qty 1

## 2018-06-26 MED ORDER — EPHEDRINE SULFATE 50 MG/ML IJ SOLN
INTRAMUSCULAR | Status: DC | PRN
  Administered 2018-06-26: 10 mg via INTRAVENOUS

## 2018-06-26 MED ORDER — HEMOSTATIC AGENTS (NO CHARGE) OPTIME
TOPICAL | Status: DC | PRN
  Administered 2018-06-26: 1 via TOPICAL

## 2018-06-26 MED ORDER — HYDROCODONE-ACETAMINOPHEN 5-325 MG PO TABS: 1.0000 | ORAL_TABLET | Freq: Four times a day (QID) | ORAL | 0 refills | Status: AC | PRN

## 2018-06-26 MED ORDER — BUPIVACAINE HCL (PF) 0.5 % IJ SOLN
INTRAMUSCULAR | Status: AC
  Filled 2018-06-26: qty 30

## 2018-06-26 MED ORDER — HYDROCODONE-ACETAMINOPHEN 7.5-325 MG PO TABS: 1.0000 | ORAL_TABLET | Freq: Once | ORAL | Status: DC | PRN

## 2018-06-26 MED ORDER — SODIUM CHLORIDE 0.9 % IR SOLN
Status: DC | PRN
  Administered 2018-06-26: 1000 mL

## 2018-06-26 MED ORDER — ONDANSETRON HCL 4 MG/2ML IJ SOLN
INTRAMUSCULAR | Status: DC | PRN
  Administered 2018-06-26: 4 mg via INTRAVENOUS

## 2018-06-26 MED ORDER — BUPIVACAINE HCL (PF) 0.5 % IJ SOLN
INTRAMUSCULAR | Status: DC | PRN
  Administered 2018-06-26: 10 mL

## 2018-06-26 MED ORDER — POVIDONE-IODINE 10 % OINT PACKET
TOPICAL_OINTMENT | CUTANEOUS | Status: DC | PRN
  Administered 2018-06-26: 1 via TOPICAL

## 2018-06-26 MED ORDER — FENTANYL CITRATE (PF) 100 MCG/2ML IJ SOLN
25.0000 ug | INTRAMUSCULAR | Status: DC | PRN
  Administered 2018-06-26 (×2): 50 ug via INTRAVENOUS
  Filled 2018-06-26: qty 2

## 2018-06-26 MED ORDER — FENTANYL CITRATE (PF) 100 MCG/2ML IJ SOLN
INTRAMUSCULAR | Status: AC
  Filled 2018-06-26: qty 2

## 2018-06-26 MED ORDER — FENTANYL CITRATE (PF) 100 MCG/2ML IJ SOLN
INTRAMUSCULAR | Status: DC | PRN
  Administered 2018-06-26 (×7): 50 ug via INTRAVENOUS

## 2018-06-26 MED ORDER — SUGAMMADEX SODIUM 200 MG/2ML IV SOLN
INTRAVENOUS | Status: DC | PRN
  Administered 2018-06-26: 200 mg via INTRAVENOUS

## 2018-06-26 MED ORDER — POVIDONE-IODINE 10 % EX OINT
TOPICAL_OINTMENT | CUTANEOUS | Status: AC
  Filled 2018-06-26: qty 1

## 2018-06-26 SURGICAL SUPPLY — 45 items
APPLIER CLIP ROT 10 11.4 M/L (STAPLE) ×2
BAG RETRIEVAL 10 (BASKET) ×1
CHLORAPREP W/TINT 26ML (MISCELLANEOUS) ×2 IMPLANT
CLIP APPLIE ROT 10 11.4 M/L (STAPLE) ×1 IMPLANT
CLOTH BEACON ORANGE TIMEOUT ST (SAFETY) ×2 IMPLANT
COVER LIGHT HANDLE STERIS (MISCELLANEOUS) ×4 IMPLANT
DECANTER SPIKE VIAL GLASS SM (MISCELLANEOUS) ×2 IMPLANT
ELECT REM PT RETURN 9FT ADLT (ELECTROSURGICAL) ×2
ELECTRODE REM PT RTRN 9FT ADLT (ELECTROSURGICAL) ×1 IMPLANT
FILTER SMOKE EVAC LAPAROSHD (FILTER) ×2 IMPLANT
GLOVE BIOGEL PI IND STRL 6.5 (GLOVE) ×1 IMPLANT
GLOVE BIOGEL PI IND STRL 7.0 (GLOVE) ×3 IMPLANT
GLOVE BIOGEL PI INDICATOR 6.5 (GLOVE) ×1
GLOVE BIOGEL PI INDICATOR 7.0 (GLOVE) ×3
GLOVE ECLIPSE 6.5 STRL STRAW (GLOVE) ×2 IMPLANT
GLOVE SURG SS PI 6.5 STRL IVOR (GLOVE) ×2 IMPLANT
GLOVE SURG SS PI 7.5 STRL IVOR (GLOVE) ×2 IMPLANT
GOWN STRL REUS W/ TWL XL LVL3 (GOWN DISPOSABLE) ×1 IMPLANT
GOWN STRL REUS W/TWL LRG LVL3 (GOWN DISPOSABLE) ×4 IMPLANT
GOWN STRL REUS W/TWL XL LVL3 (GOWN DISPOSABLE) ×1
HEMOSTAT SNOW SURGICEL 2X4 (HEMOSTASIS) ×2 IMPLANT
INST SET LAPROSCOPIC AP (KITS) ×2 IMPLANT
IV NS IRRIG 3000ML ARTHROMATIC (IV SOLUTION) IMPLANT
KIT TURNOVER KIT A (KITS) ×2 IMPLANT
MANIFOLD NEPTUNE II (INSTRUMENTS) ×2 IMPLANT
NEEDLE INSUFFLATION 14GA 120MM (NEEDLE) ×2 IMPLANT
NS IRRIG 1000ML POUR BTL (IV SOLUTION) ×2 IMPLANT
PACK LAP CHOLE LZT030E (CUSTOM PROCEDURE TRAY) ×2 IMPLANT
PAD ARMBOARD 7.5X6 YLW CONV (MISCELLANEOUS) ×2 IMPLANT
PENCIL HANDSWITCHING (ELECTRODE) ×2 IMPLANT
SET BASIN LINEN APH (SET/KITS/TRAYS/PACK) ×2 IMPLANT
SET TUBE IRRIG SUCTION NO TIP (IRRIGATION / IRRIGATOR) IMPLANT
SLEEVE ENDOPATH XCEL 5M (ENDOMECHANICALS) ×2 IMPLANT
SPONGE GAUZE 2X2 8PLY STRL LF (GAUZE/BANDAGES/DRESSINGS) ×2 IMPLANT
STAPLER VISISTAT (STAPLE) ×2 IMPLANT
SUT VICRYL 0 UR6 27IN ABS (SUTURE) ×4 IMPLANT
SYS BAG RETRIEVAL 10MM (BASKET) ×1
SYSTEM BAG RETRIEVAL 10MM (BASKET) ×1 IMPLANT
TAPE PAPER 2X10 WHT MICROPORE (GAUZE/BANDAGES/DRESSINGS) ×2 IMPLANT
TROCAR ENDO BLADELESS 11MM (ENDOMECHANICALS) ×2 IMPLANT
TROCAR XCEL NON-BLD 5MMX100MML (ENDOMECHANICALS) ×2 IMPLANT
TROCAR XCEL UNIV SLVE 11M 100M (ENDOMECHANICALS) ×2 IMPLANT
TUBE CONNECTING 12X1/4 (SUCTIONS) ×2 IMPLANT
TUBING INSUFFLATION (TUBING) ×2 IMPLANT
WARMER LAPAROSCOPE (MISCELLANEOUS) ×2 IMPLANT

## 2018-06-26 NOTE — Interval H&P Note (Signed)
History and Physical Interval Note:  06/26/2018 9:40 AM  Danielle Price  has presented today for surgery, with the diagnosis of cholelithiasis  The various methods of treatment have been discussed with the patient and family. After consideration of risks, benefits and other options for treatment, the patient has consented to  Procedure(s): LAPAROSCOPIC CHOLECYSTECTOMY (N/A) as a surgical intervention .  The patient's history has been reviewed, patient examined, no change in status, stable for surgery.  I have reviewed the patient's chart and labs.  Questions were answered to the patient's satisfaction.     Aviva Signs

## 2018-06-26 NOTE — Transfer of Care (Signed)
Immediate Anesthesia Transfer of Care Note  Patient: Danielle Price  Procedure(s) Performed: LAPAROSCOPIC CHOLECYSTECTOMY (N/A Abdomen)  Patient Location: PACU  Anesthesia Type:General  Level of Consciousness: awake and alert   Airway & Oxygen Therapy: Patient Spontanous Breathing  Post-op Assessment: Report given to RN and Post -op Vital signs reviewed and stable  Post vital signs: Reviewed  Last Vitals:  Vitals Value Taken Time  BP 157/73 06/26/2018 10:56 AM  Temp    Pulse 84 06/26/2018 11:00 AM  Resp 17 06/26/2018 11:00 AM  SpO2 95 % 06/26/2018 11:00 AM  Vitals shown include unvalidated device data.  Last Pain:  Vitals:   06/26/18 0902  TempSrc: Oral  PainSc: 0-No pain      Patients Stated Pain Goal: 6 (95/18/84 1660)  Complications: No apparent anesthesia complications

## 2018-06-26 NOTE — Addendum Note (Signed)
Addendum  created 06/26/18 1117 by Ollen Bowl, CRNA   Charge Capture section accepted

## 2018-06-26 NOTE — Addendum Note (Signed)
Addendum  created 06/26/18 1120 by Vista Deck, CRNA   Delete clinical note, Sign clinical note

## 2018-06-26 NOTE — Anesthesia Postprocedure Evaluation (Signed)
Anesthesia Post Note  Patient: Danielle Price  Procedure(s) Performed: LAPAROSCOPIC CHOLECYSTECTOMY (N/A Abdomen)  Patient location during evaluation: PACU Anesthesia Type: General Level of consciousness: awake and alert and patient cooperative Pain management: satisfactory to patient Vital Signs Assessment: post-procedure vital signs reviewed and stable Respiratory status: spontaneous breathing Cardiovascular status: stable Postop Assessment: no apparent nausea or vomiting Anesthetic complications: no     Last Vitals:  Vitals:   06/26/18 0902  BP: (!) 158/83  Pulse: 75  Resp: 20  Temp: 37.1 C  SpO2: 97%    Last Pain:  Vitals:   06/26/18 1100  TempSrc:   PainSc: 8                  Hayven Croy

## 2018-06-26 NOTE — Op Note (Signed)
Patient:  Danielle Price  DOB:  Apr 02, 1941  MRN:  [id number]   Preop Diagnosis: Biliary colic, cholelithiasis, skin tag of abdominal wall  Postop Diagnosis: Same  Procedure: Laparoscopic cholecystectomy, shave excision of skin tag abdominal wall  Surgeon: Aviva Signs, MD  Anes: General endotracheal  Indications: Patient is a 77 year old white female who presents with biliary colic secondary to cholelithiasis.  She also has a skin tag in the epigastric region of the abdominal wall that she would like removed.  The risks and benefits of the procedure including bleeding, infection, hepatobiliary injury, and the possibility of an open procedure were fully explained to the patient, who gave informed consent.  She was originally scheduled to have her surgery done by Dr. Constance Haw, but she is unavailable.  Patient understands this and agrees with having me proceeding with the procedure.  Procedure note: The patient was placed in supine position.  After induction of general endotracheal anesthesia, the abdomen was prepped and draped using the usual sterile technique with DuraPrep.  Surgical site confirmation was performed.  An infraumbilical incision was made down to the fascia.  A Veress needle was introduced into the abdominal cavity and confirmation of placement was done using the saline drop test.  The abdomen was then insufflated to 16 mmHg pressure.  An 11 mm trocar was introduced into the abdominal cavity under direct visualization without difficulty.  The patient was placed in reverse Trendelenburg position and an additional 11 mm trocar was placed in the epigastric region and 5 mm trochars were placed the right upper quadrant and right flank regions.  The liver was inspected and noted to be within normal limits.  The gallbladder was retracted in a dynamic fashion in order to provide a critical view of the triangle of Calot.  The cystic duct was first identified.  Its juncture to the infundibulum  was fully identified.  Endoclips were placed proximally and distally on cystic duct, and the cystic duct was divided.  This was likewise done to the cystic artery.  The gallbladder was freed away from the gallbladder fossa using Bovie electrocautery.  The gallbladder was delivered through the epigastric trocar site using an Endo Catch bag.  The gallbladder fossa was inspected and no abnormal bleeding or bile leakage was noted.  Surgicel was placed in the gallbladder fossa.  All fluid and air were then evacuated from the abdominal cavity prior to the removal of the trochars.  All wounds were irrigated with normal saline.  All wounds were injected with 0.5% Sensorcaine.  The infraumbilical fascia as well as epigastric fascia were reapproximated using 0 Vicryl interrupted sutures.  All skin incisions were closed using staples.  Betadine ointment was applied.  The 4 to 5 mm skin tag in the epigastric region was removed using the shave technique with Bovie electrocautery.  It was disposed of.  The patient was extubated in the operating room and transferred to PACU in stable condition.  Complications: None  EBL: Minimal  Specimen: Gallbladder

## 2018-06-26 NOTE — Anesthesia Procedure Notes (Signed)
Procedure Name: Intubation Date/Time: 06/26/2018 10:02 AM Performed by: Ollen Bowl, CRNA Pre-anesthesia Checklist: Patient identified, Patient being monitored, Timeout performed, Emergency Drugs available and Suction available Patient Re-evaluated:Patient Re-evaluated prior to induction Oxygen Delivery Method: Circle system utilized Preoxygenation: Pre-oxygenation with 100% oxygen Induction Type: IV induction Ventilation: Mask ventilation without difficulty Laryngoscope Size: Mac and 3 Grade View: Grade I Tube type: Oral Tube size: 7.0 mm Number of attempts: 1 Airway Equipment and Method: Stylet Placement Confirmation: ETT inserted through vocal cords under direct vision,  positive ETCO2 and breath sounds checked- equal and bilateral Secured at: 21 cm Tube secured with: Tape Dental Injury: Teeth and Oropharynx as per pre-operative assessment

## 2018-06-26 NOTE — Anesthesia Postprocedure Evaluation (Deleted)
Anesthesia Post Note  Patient: Danielle Price  Procedure(s) Performed: LAPAROSCOPIC CHOLECYSTECTOMY (N/A Abdomen)  Patient location during evaluation: PACU Anesthesia Type: General Level of consciousness: awake and alert and patient cooperative Pain management: satisfactory to patient Vital Signs Assessment: post-procedure vital signs reviewed and stable Respiratory status: spontaneous breathing and non-rebreather facemask (Wheezing decreased ) Cardiovascular status: stable Postop Assessment: no apparent nausea or vomiting Anesthetic complications: no     Last Vitals:  Vitals:   06/26/18 0902  BP: (!) 158/83  Pulse: 75  Resp: 20  Temp: 37.1 C  SpO2: 97%    Last Pain:  Vitals:   06/26/18 0902  TempSrc: Oral  PainSc: 0-No pain                 Jynesis Nakamura

## 2018-06-26 NOTE — Anesthesia Preprocedure Evaluation (Signed)
Anesthesia Evaluation  Patient identified by MRN, date of birth, ID band Patient awake    Reviewed: Allergy & Precautions, NPO status , Patient's Chart, lab work & pertinent test results  Airway Mallampati: II  TM Distance: >3 FB Neck ROM: Full    Dental no notable dental hx. (+) Teeth Intact Front L tooth discolored :   Pulmonary neg pulmonary ROS, Current Smoker,    Pulmonary exam normal breath sounds clear to auscultation       Cardiovascular Exercise Tolerance: Good negative cardio ROS Normal cardiovascular examI Rhythm:Regular Rate:Normal  Denies CP/MI Is a smoker  Denies DOE, but is sedentary    Neuro/Psych Anxiety negative neurological ROS  negative psych ROS   GI/Hepatic negative GI ROS, Neg liver ROS, GERD  Medicated and Controlled,PRN meds only for GERD  Denies any symptoms at present    Endo/Other  negative endocrine ROS  Renal/GU negative Renal ROS  negative genitourinary   Musculoskeletal negative musculoskeletal ROS (+)   Abdominal   Peds negative pediatric ROS (+)  Hematology negative hematology ROS (+) anemia ,   Anesthesia Other Findings   Reproductive/Obstetrics negative OB ROS                             Anesthesia Physical Anesthesia Plan  ASA: II  Anesthesia Plan: General   Post-op Pain Management:    Induction: Intravenous  PONV Risk Score and Plan:   Airway Management Planned: Oral ETT  Additional Equipment:   Intra-op Plan:   Post-operative Plan: Extubation in OR  Informed Consent: I have reviewed the patients History and Physical, chart, labs and discussed the procedure including the risks, benefits and alternatives for the proposed anesthesia with the patient or authorized representative who has indicated his/her understanding and acceptance.   Dental advisory given  Plan Discussed with: CRNA  Anesthesia Plan Comments:          Anesthesia Quick Evaluation

## 2018-06-26 NOTE — Discharge Instructions (Signed)

## 2018-06-27 ENCOUNTER — Encounter (HOSPITAL_COMMUNITY): Payer: Self-pay | Admitting: General Surgery

## 2018-07-04 ENCOUNTER — Encounter: Payer: Self-pay | Admitting: General Surgery

## 2018-07-04 ENCOUNTER — Ambulatory Visit (INDEPENDENT_AMBULATORY_CARE_PROVIDER_SITE_OTHER): Payer: Self-pay | Admitting: General Surgery

## 2018-07-04 VITALS — BP 168/88 | HR 62 | Temp 97.1°F | Resp 16 | Wt 125.0 lb

## 2018-07-04 DIAGNOSIS — Z09 Encounter for follow-up examination after completed treatment for conditions other than malignant neoplasm: Secondary | ICD-10-CM

## 2018-07-04 NOTE — Progress Notes (Signed)
Subjective:     Danielle Price  Status post laparoscopic cholecystectomy.  Doing well.  Has minimal incisional pain.  Still has some left upper quadrant abdominal pain which she has had chronically for some time now.  No referral to the right side.  She is happy with that. Objective:    BP (!) 168/88 (BP Location: Left Arm, Patient Position: Sitting, Cuff Size: Normal)   Pulse 62   Temp (!) 97.1 F (36.2 C) (Temporal)   Resp 16   Wt 125 lb (56.7 kg)   BMI 21.46 kg/m   General:  alert, cooperative and no distress  Abdomen soft, incisions healing well.  Staples removed, Steri-Strips applied. Final pathology consistent with diagnosis.     Assessment:    Doing well postoperatively.    Plan:   Gradually return to normal activity.  Follow-up here as needed.

## 2019-01-22 DIAGNOSIS — J069 Acute upper respiratory infection, unspecified: Secondary | ICD-10-CM | POA: Diagnosis not present

## 2019-01-22 DIAGNOSIS — H6592 Unspecified nonsuppurative otitis media, left ear: Secondary | ICD-10-CM | POA: Diagnosis not present

## 2019-12-12 IMAGING — DX DG ABDOMEN ACUTE W/ 1V CHEST
3 series · 3 of 3 positions shown · non-contrast
Comparison: None.

CLINICAL DATA: sudden onset of LT upper abdominal pain that
radiates RUQ that began approx 40 minutes.

EXAM:
DG ABDOMEN ACUTE W/ 1V CHEST

[chest pa]
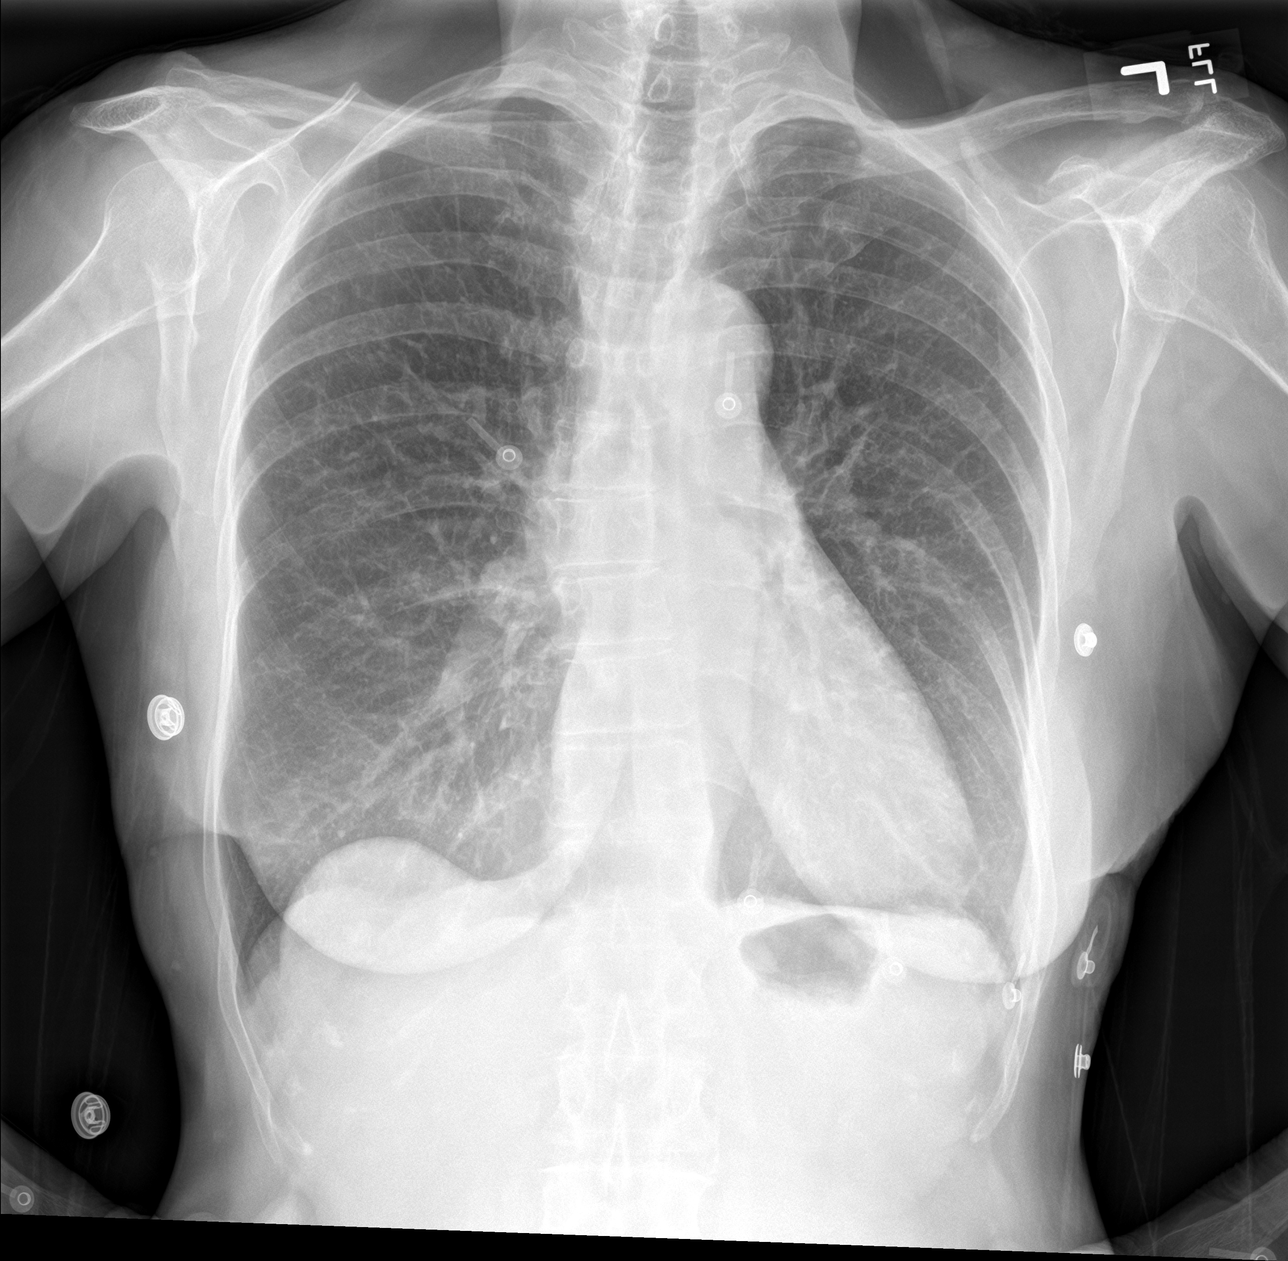

[abdomen erect]
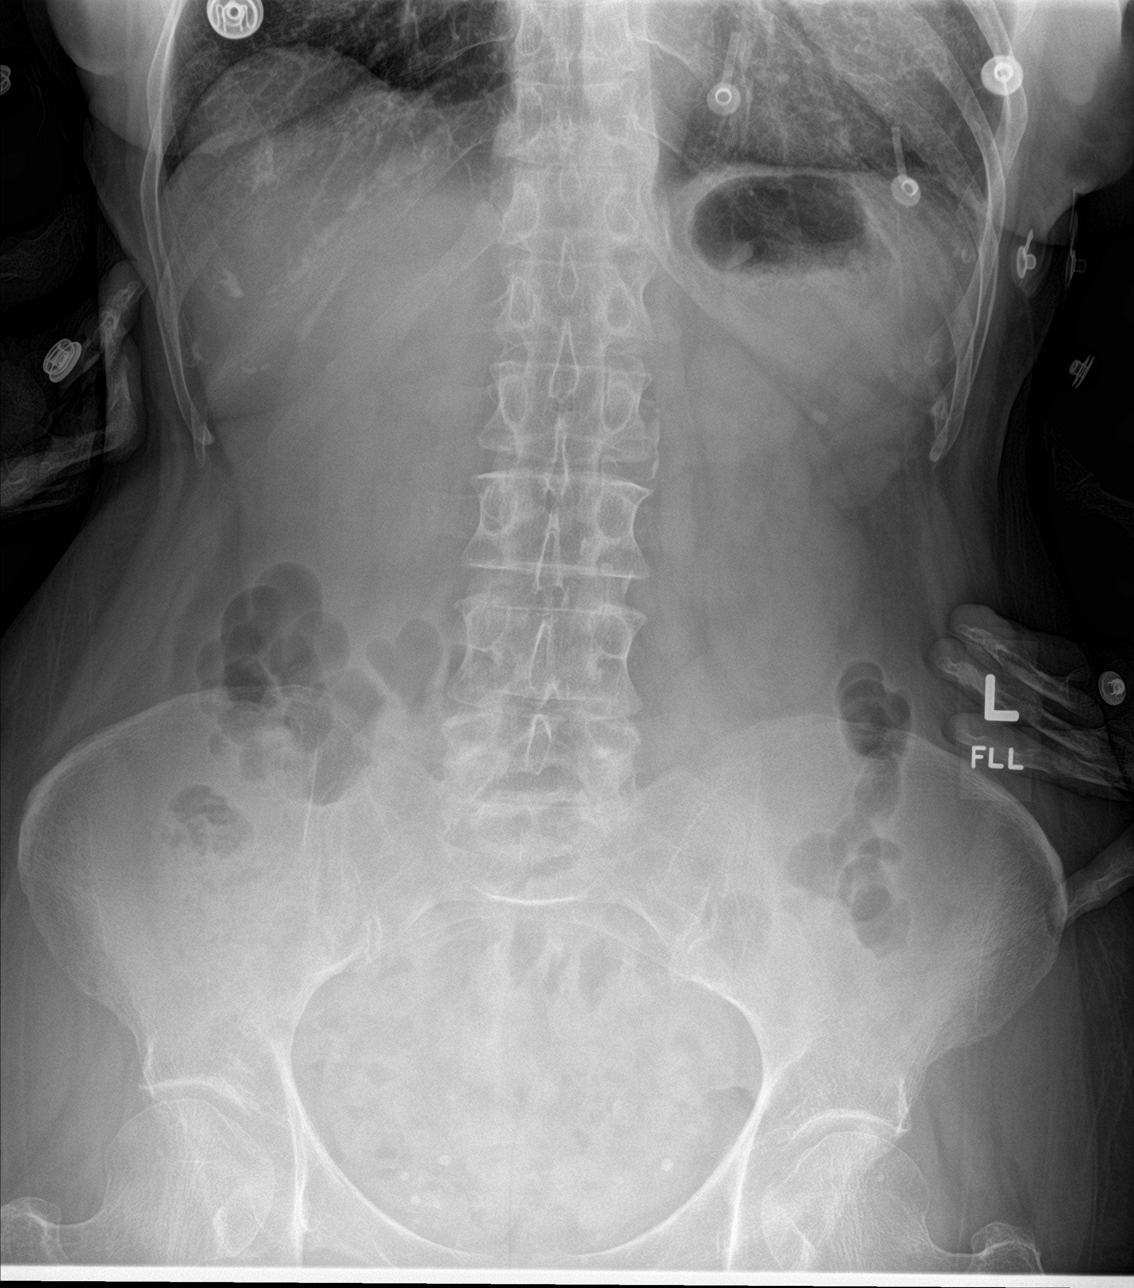

[abdomen supine]
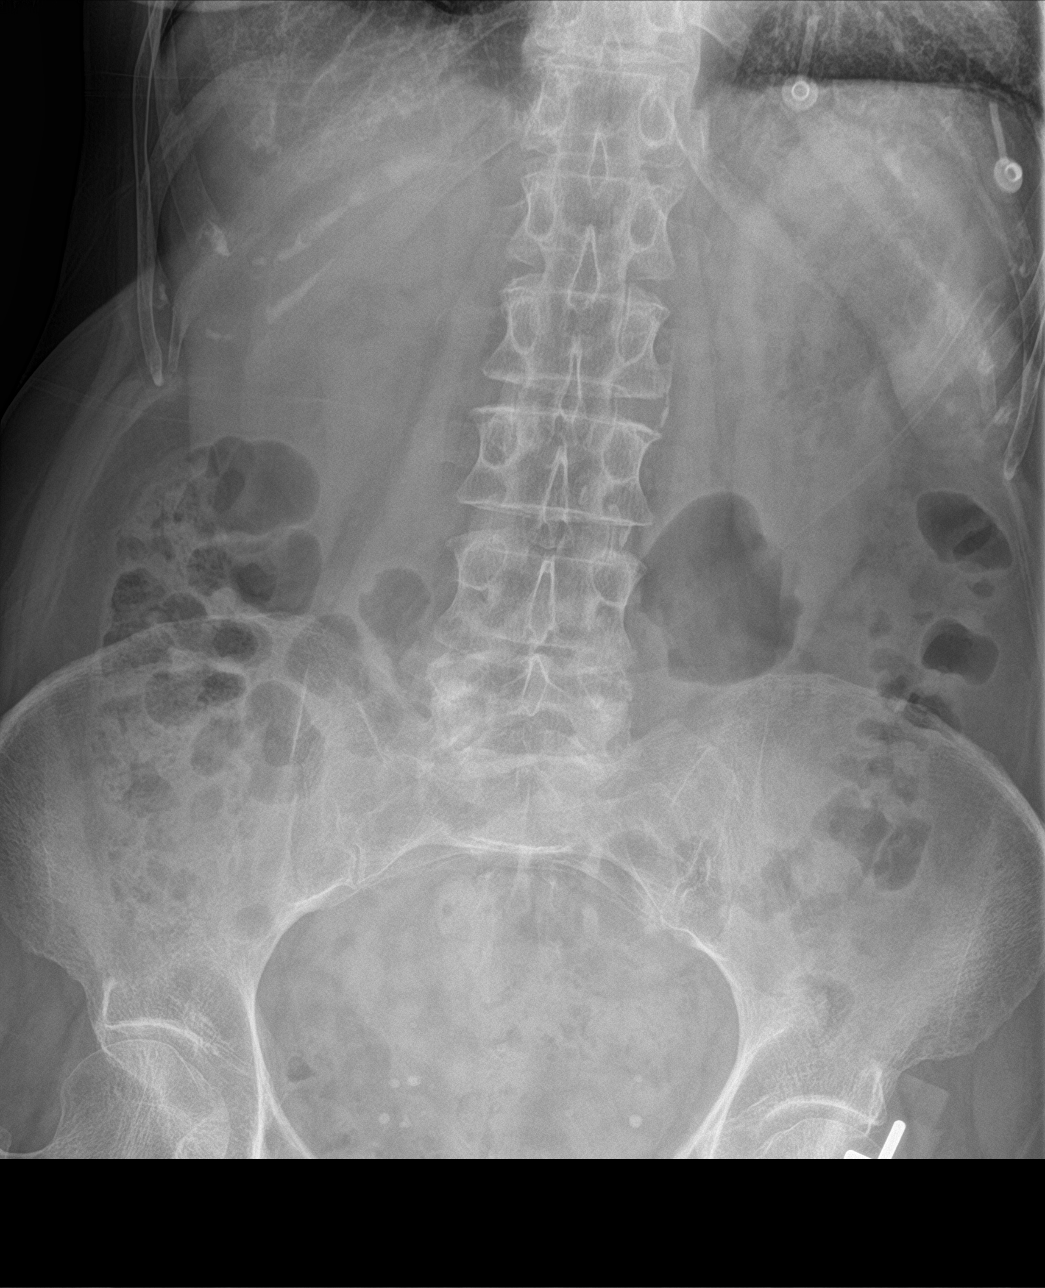

[3 of 3 positions shown; findings below may reference images not displayed]

FINDINGS: Single-view of the chest: Heart size and mediastinal contours are
within normal limits. Lungs appear hyperexpanded suggesting COPD.
Coarse lung markings noted bilaterally suggesting associated chronic
bronchitic change and/or chronic interstitial lung disease. No
confluent opacity to suggest a developing pneumonia. No pleural
effusion or pneumothorax seen.

Supine and upright views of the abdomen: Bowel gas pattern is
nonobstructive. No evidence of soft tissue mass or abnormal fluid
collection. No evidence of free intraperitoneal air. No evidence of
renal or ureteral calculi seen. Scattered calcifications in the
pelvis are likely vascular phleboliths. Osseous structures of the
abdomen and pelvis are unremarkable.
IMPRESSION: 1. No evidence of acute cardiopulmonary abnormality. No evidence of
pneumonia or pulmonary edema.
2. Hyperexpanded lungs indicating COPD. Probable associated chronic
bronchitic change and/or chronic interstitial lung disease.
3. No evidence of acute intra-abdominal abnormality. Nonobstructive
bowel gas pattern.

## 2019-12-12 IMAGING — CT CT ABD-PELV W/ CM
2 of 5 series · 16 of 46 positions shown, 18 images · IV contrast (Isovue)
Comparison: Right upper quadrant ultrasound from same day. CT
abdomen pelvis dated January 05, 2016.

CLINICAL DATA: Sudden onset upper abdominal pain after eating
supine hot dog.

EXAM:
CT ABDOMEN AND PELVIS WITH CONTRAST
TECHNIQUE: Multidetector CT imaging of the abdomen and pelvis was performed
using the standard protocol following bolus administration of
intravenous contrast.
CONTRAST:  100mL NXSXFG-T66 IOPAMIDOL (NXSXFG-T66) INJECTION 61%

[Series 2: axial st · axial · 0.69mm/px · z∈[+1012,+1388]mm · 13 of 86 slices shown, 15 images]
[im 6/86  soft-tissue]
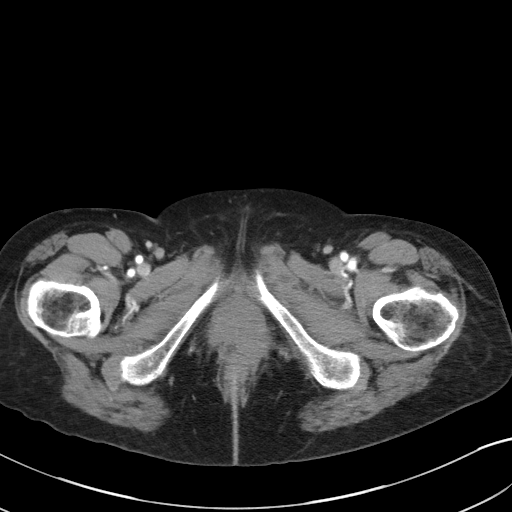
[im 6/86  bone]
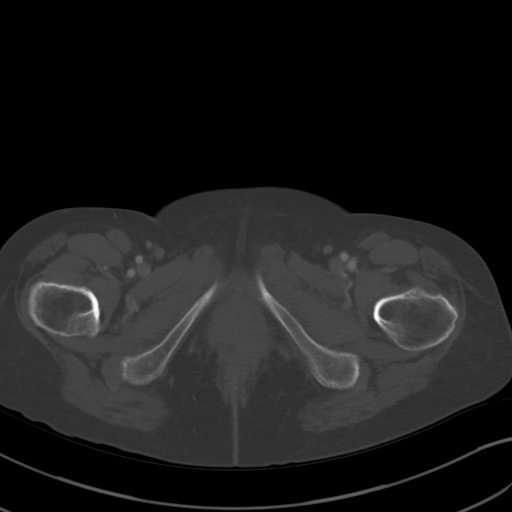
[im 11/86  soft-tissue]
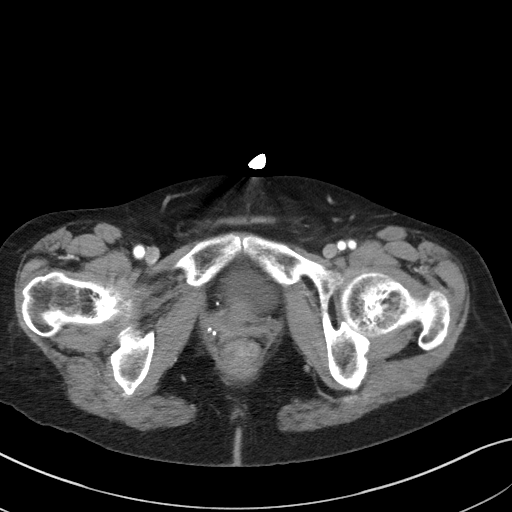
[im 21/86  soft-tissue]
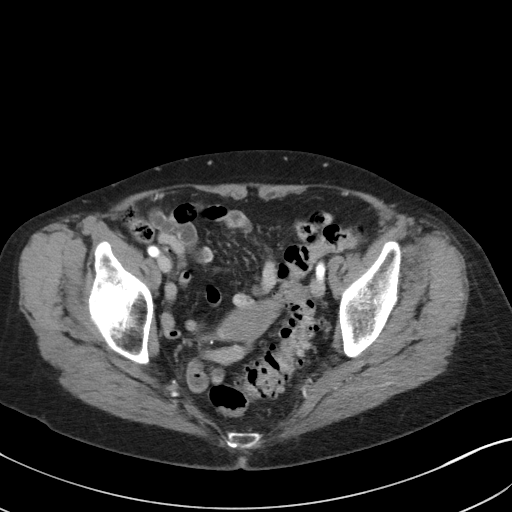
[im 26/86  soft-tissue]
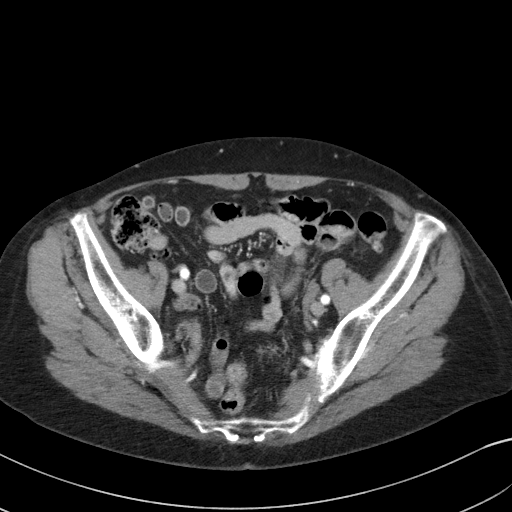
[im 31/86  soft-tissue]
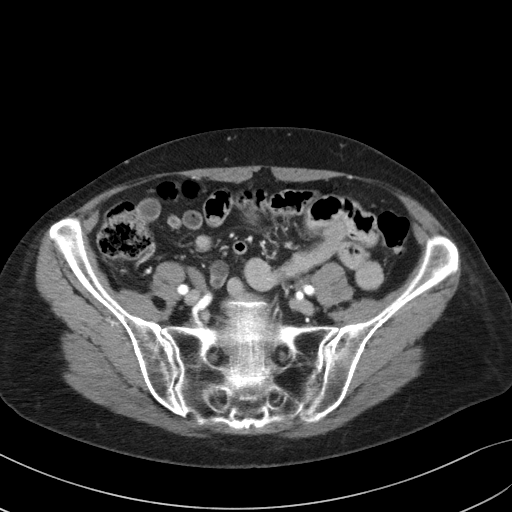
[im 36/86  soft-tissue]
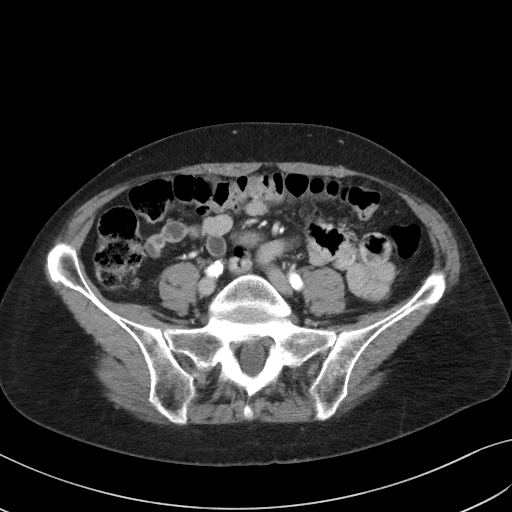
[im 46/86  soft-tissue]
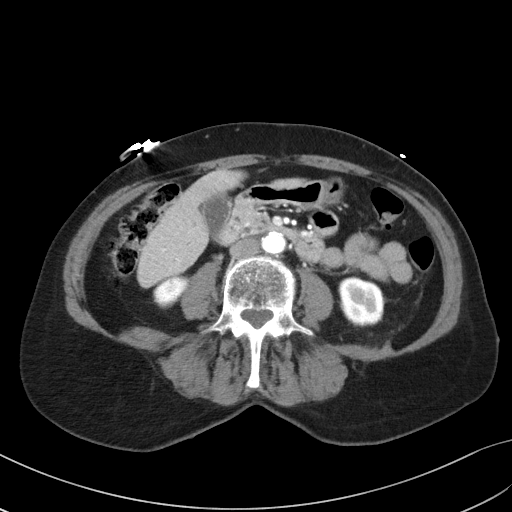
[im 51/86  soft-tissue]
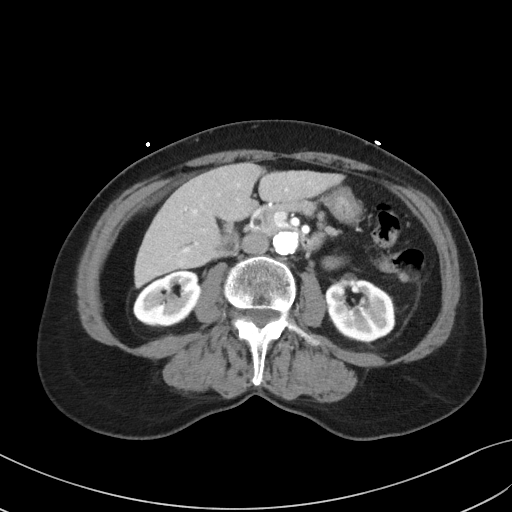
[im 56/86  soft-tissue]
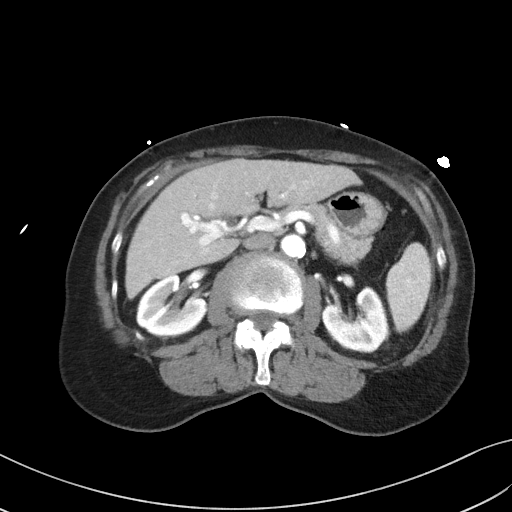
[im 56/86  bone]
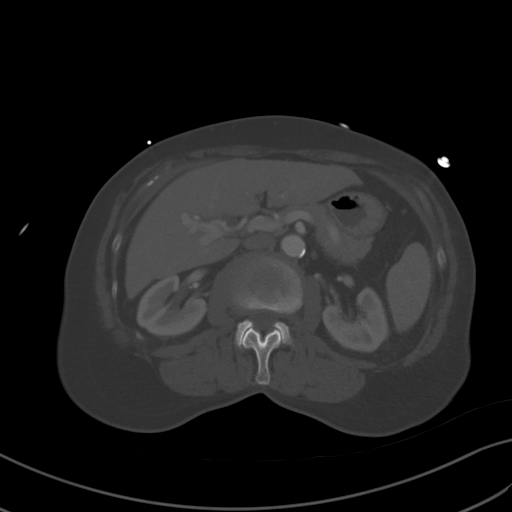
[im 61/86  soft-tissue]
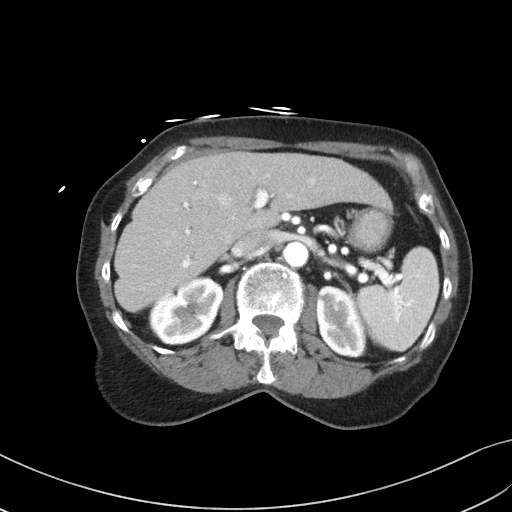
[im 66/86  soft-tissue]
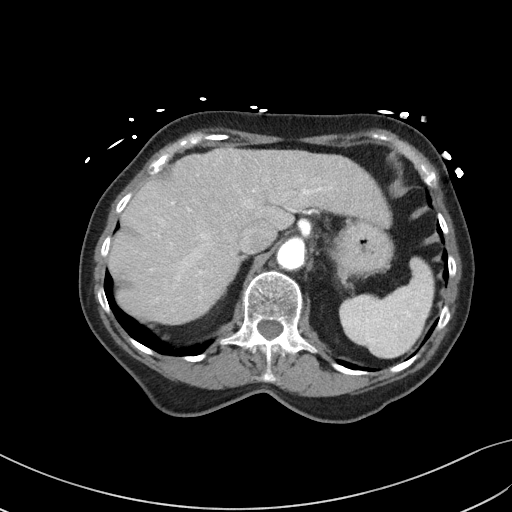
[im 76/86  soft-tissue]
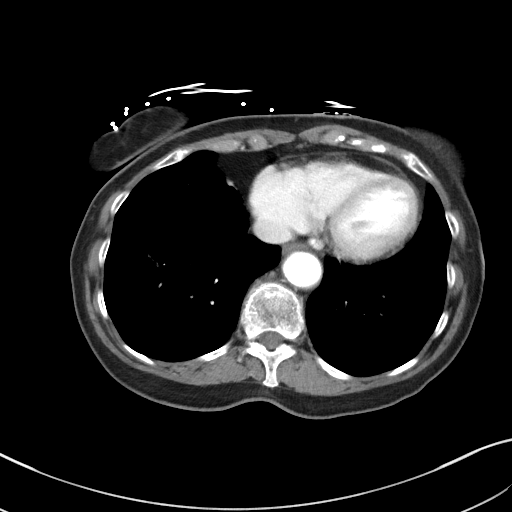
[im 81/86  soft-tissue]
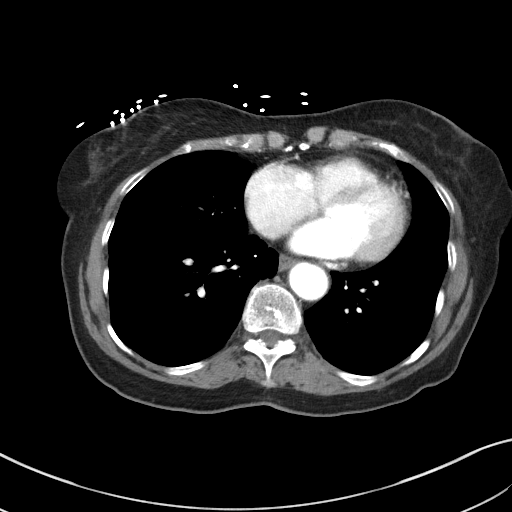

[Series 6: coronal st · coronal · 0.74mm/px · 3 of 95 slices shown]
[im 32/95  soft-tissue]
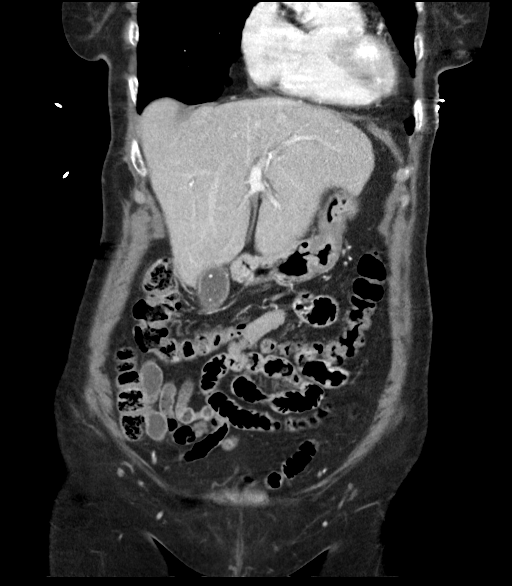
[im 42/95  soft-tissue]
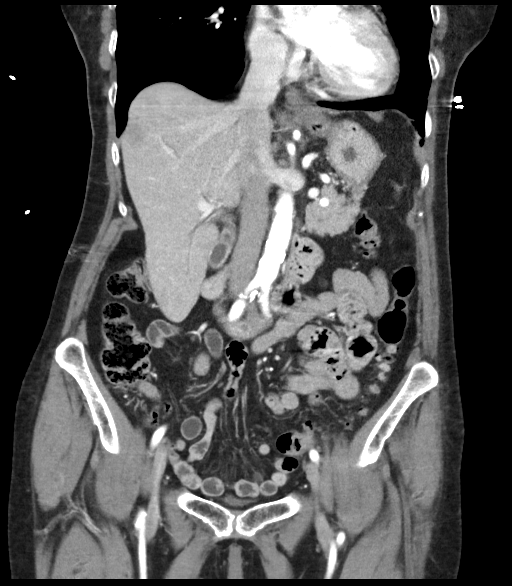
[im 53/95  soft-tissue]
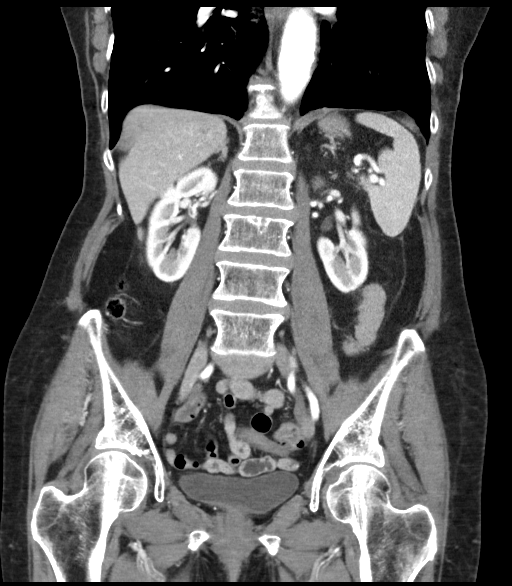

[16 of 46 positions shown; findings below may reference images not displayed]

FINDINGS: Lower chest: No acute abnormality.  Mild emphysema.

Hepatobiliary: Unchanged 2.0 cm hemangioma in the right hepatic
lobe. Unchanged 1.3 cm hemangioma in the left hepatic lobe.
Cholelithiasis. No gallbladder wall thickening or pericholecystic
fluid. No biliary dilatation.

Pancreas: Unremarkable. No pancreatic ductal dilatation or
surrounding inflammatory changes.

Spleen: Normal in size without focal abnormality.

Adrenals/Urinary Tract: The adrenal glands are unremarkable.
Subcentimeter low-density lesions in both kidneys remain too small
to characterize but are unchanged. No renal or ureteral calculi. No
hydronephrosis. The bladder is unremarkable.

Stomach/Bowel: Stomach is within normal limits. Appendix appears
normal. No evidence of bowel wall thickening, distention, or
inflammatory changes. Moderate left-sided colonic diverticulosis.

Vascular/Lymphatic: Aortic atherosclerosis. No enlarged abdominal or
pelvic lymph nodes.

Reproductive: Uterus and bilateral adnexa are unremarkable.

Other: No free fluid or pneumoperitoneum.

Musculoskeletal: No acute or significant osseous findings.
IMPRESSION: 1. Cholelithiasis without evidence of acute cholecystitis.
2.  Aortic atherosclerosis ([id number]-8M0.0).
